# Patient Record
Sex: Male | Born: 1970 | Race: Asian | Hispanic: Yes | Marital: Married | Smoking: Never smoker
Health system: Southern US, Community
[De-identification: ages and names within clinical notes are randomized; demographics above are authoritative.]

## PROBLEM LIST (undated history)

## (undated) DIAGNOSIS — T7840XA Allergy, unspecified, initial encounter: Secondary | ICD-10-CM

## (undated) DIAGNOSIS — I00 Rheumatic fever without heart involvement: Secondary | ICD-10-CM

## (undated) DIAGNOSIS — Z8711 Personal history of peptic ulcer disease: Secondary | ICD-10-CM

## (undated) DIAGNOSIS — Z8719 Personal history of other diseases of the digestive system: Secondary | ICD-10-CM

## (undated) DIAGNOSIS — A159 Respiratory tuberculosis unspecified: Secondary | ICD-10-CM

## (undated) DIAGNOSIS — Z9189 Other specified personal risk factors, not elsewhere classified: Secondary | ICD-10-CM

## (undated) HISTORY — PX: NO PAST SURGERIES: SHX2092

## (undated) HISTORY — DX: Rheumatic fever without heart involvement: I00

## (undated) HISTORY — DX: Respiratory tuberculosis unspecified: A15.9

## (undated) HISTORY — DX: Personal history of other diseases of the digestive system: Z87.19

## (undated) HISTORY — DX: Allergy, unspecified, initial encounter: T78.40XA

## (undated) HISTORY — DX: Personal history of peptic ulcer disease: Z87.11

## (undated) HISTORY — DX: Other specified personal risk factors, not elsewhere classified: Z91.89

---

## 2005-08-07 ENCOUNTER — Encounter: Admission: RE | Admit: 2005-08-07 | Discharge: 2005-08-07 | Payer: Self-pay | Admitting: Family Medicine

## 2010-07-13 ENCOUNTER — Encounter: Payer: Self-pay | Admitting: Family Medicine

## 2010-07-13 ENCOUNTER — Ambulatory Visit (INDEPENDENT_AMBULATORY_CARE_PROVIDER_SITE_OTHER): Payer: BC Managed Care – PPO | Admitting: Family Medicine

## 2010-07-13 DIAGNOSIS — M25539 Pain in unspecified wrist: Secondary | ICD-10-CM

## 2010-07-13 DIAGNOSIS — M549 Dorsalgia, unspecified: Secondary | ICD-10-CM

## 2010-07-13 HISTORY — DX: Dorsalgia, unspecified: M54.9

## 2010-07-13 HISTORY — DX: Pain in unspecified wrist: M25.539

## 2010-07-18 NOTE — Assessment & Plan Note (Signed)
Summary: LOWER BACK AND WRIST PAIN/NP/LP # [phone]   Vital Signs:  Patient profile:   40 year old male Height:      67 inches (170.18 cm) Weight:      170.2 pounds (77.36 kg) BMI:     26.75 Temp:     98.0 degrees F (36.67 degrees C) oral Pulse rate:   60 / minute BP sitting:   121 / 70  (right arm)  Vitals Entered By: Baxter Hire) (July 13, 2010 2:51 PM) CC: lower back and wrist pain Pain Assessment Patient in pain? yes     Location: left wrist Intensity: 4 Nutritional Status BMI of 25 - 29 = overweight  Does patient need assistance? Functional Status Self care Ambulation Normal   CC:  lower back and wrist pain.  History of Present Illness: 40 yo M here with left wrist and low back pain  1. L wrist pain Patient denies known injury States over past 3 months has had pain more on dorsal aspect of left wrist Uses computer a lot at work, does a lot of typing. Pain now hurting up to elbow. Moved mouse to right side and noticed improvement over past week. No swelling or bruising. Worse with full extension at wrist joint. No numbness or tingling.  2. Back pain Also hurts for past 3 months Slowly worsening over that time Has episodes of flare ups. No known injury. Prolonged sitting or prolonged standing worsens, better with movement No FH back problems No bowel/bladder issues No numbness or tingling No radiation into buttocks or legs  Habits & Providers  Alcohol-Tobacco-Diet     Alcohol drinks/day: 1-every other day     Tobacco Status: never  Problems Prior to Update: None  Medications Prior to Update: 1)  None  Allergies (verified): No Known Drug Allergies  Family History: negative for HTN, DM, heart disease  Social History: no tobacco use occasional alcohol use works as Airline pilot for [employer] Status:  never  Physical Exam  General:  Well-developed,well-nourished,in no acute distress; alert,appropriate and cooperative  throughout examination Msk:  L wrist: No gross deformity, swelling, or bruising. TTP dorsal wrist at radiocarpal joint.  No other TTP about wrist, fingers.  No TTP at elbow over epicondyles. FROM wrist but pain with full extension at wrist.  No pain with finger extension.   Strength 5/5 with all wrist motions - no pain with resisted wrist extension. No pain with ulnar deviation.  No TTP at [facility]. 2+ radial pulses Negative tinels and phalens  R wrist: FROM without pain, swelling, weakness.  Back: No gross deformity, swelling, or bruising. Mild pain with full flexion FROM. No midline or bony TTP.  Minimal TTP bilateral paraspinal regions lumbar spine. Strength 5/5 BLEs MSRs 2+ and equal in bilateral patellar and achilles tendons Negative SLRs Negative Fabers and piriformis stretches.   Impression & Recommendations:  Problem # 1:  WRIST PAIN, LEFT (JYN-829.56) Assessment New  Wrist exam benign outside of pain on full extension and tenderness at radiocarpal joint.  2/2 overuse - more likely synovitis than tendinopathy given resisted motions of fingers and wrist do not reproduce his pain.  DJD is possible but less likely.  Relative rest, icing, nsaids, wrist brace - should continue to improve over the next 4 weeks then can discontinue brace and start strengthening exercises.  Orders: Brace Wrist (O1308)  Problem # 2:  BACK PAIN (ICD-724.5) Assessment: New No evidence of radiculopathy and no red flag symptoms or on  exam.  Start home exercises (wants to try these for 1-2 weeks first before trying PT.  Heat for muscle spasms, mobic, flexeril as needed.  Stated at end of OV that he tried chiropractic care without benefit.  Advised to stay active.  See instructions for further.  His updated medication list for this problem includes:    Meloxicam 15 Mg Tabs (Meloxicam) .Marland Kitchen... 1 tab by mouth daily with food x 7 days then as needed    Flexeril 5 Mg Tabs (Cyclobenzaprine hcl) .Marland Kitchen... 1 tab by  mouth three times a day as needed spasms  Complete Medication List: 1)  Meloxicam 15 Mg Tabs (Meloxicam) .Marland Kitchen.. 1 tab by mouth daily with food x 7 days then as needed 2)  Flexeril 5 Mg Tabs (Cyclobenzaprine hcl) .Marland Kitchen.. 1 tab by mouth three times a day as needed spasms  Patient Instructions: 1)  Your exam is consistent with musculoskeletal back pain 2)  Take meloxicam daily with food x 7 days then as needed for wrist and back. 3)  Try flexeril as muscle relaxant if needed for spasms - can make you sleepy so try at night first and don't drive if it makes you sleepy. 4)  Stay as active as possible 5)  Start physical therapy for your low back in 1-2 weeks if home exercises/stretches aren't working. 6)  Consider massage, chiropractor, physical therapy 7)  Knee to chest, opposite chest, pelvic tilt, cat/camel are usually the best stretches/exercises for this - hold 20-30 seconds and do 3 of each once a day. 8)  Start abdominal crunches, bicycles as well - work your way up to 3 sets of 30 of each once a day. 9)  For wrist, use medication noted above. 10)  Wrist brace will help prevent excessive wrist extension which increases tendinitis/inflammation. 11)  Follow up with me in 1 month for recheck on these issues. Prescriptions: FLEXERIL 5 MG TABS (CYCLOBENZAPRINE HCL) 1 tab by mouth three times a day as needed spasms  #60 x 0   Entered and Authorized by:   Norton Blizzard MD   Signed by:   Norton Blizzard MD on 07/13/2010   Method used:   Print then Give to Patient   RxID:   [id number] MELOXICAM 15 MG TABS (MELOXICAM) 1 tab by mouth daily with food x 7 days then as needed  #30 x 1   Entered and Authorized by:   Norton Blizzard MD   Signed by:   Norton Blizzard MD on 07/13/2010   Method used:   Print then Give to Patient   RxID:   [id number]    Orders Added: 1)  New Patient Level IV [99204] 2)  Brace Wrist [[id number]]

## 2010-08-02 ENCOUNTER — Encounter: Payer: Self-pay | Admitting: *Deleted

## 2010-10-23 ENCOUNTER — Ambulatory Visit: Payer: BC Managed Care – PPO | Attending: Family Medicine | Admitting: Physical Therapy

## 2010-10-23 DIAGNOSIS — M2560 Stiffness of unspecified joint, not elsewhere classified: Secondary | ICD-10-CM | POA: Insufficient documentation

## 2010-10-23 DIAGNOSIS — M545 Low back pain, unspecified: Secondary | ICD-10-CM | POA: Insufficient documentation

## 2010-10-23 DIAGNOSIS — IMO0001 Reserved for inherently not codable concepts without codable children: Secondary | ICD-10-CM | POA: Insufficient documentation

## 2010-10-25 ENCOUNTER — Ambulatory Visit: Payer: BC Managed Care – PPO | Admitting: Physical Therapy

## 2010-10-30 ENCOUNTER — Encounter: Payer: BC Managed Care – PPO | Admitting: Physical Therapy

## 2010-11-01 ENCOUNTER — Ambulatory Visit: Payer: BC Managed Care – PPO | Admitting: Rehabilitation

## 2011-11-28 ENCOUNTER — Encounter: Payer: Self-pay | Admitting: Family Medicine

## 2011-11-28 ENCOUNTER — Ambulatory Visit: Payer: BC Managed Care – PPO

## 2011-11-28 ENCOUNTER — Ambulatory Visit (INDEPENDENT_AMBULATORY_CARE_PROVIDER_SITE_OTHER): Payer: BC Managed Care – PPO | Admitting: Family Medicine

## 2011-11-28 VITALS — BP 112/64 | HR 66 | Temp 97.9°F | Resp 16 | Ht 68.5 in | Wt 161.0 lb

## 2011-11-28 DIAGNOSIS — M25569 Pain in unspecified knee: Secondary | ICD-10-CM

## 2011-11-28 DIAGNOSIS — M7650 Patellar tendinitis, unspecified knee: Secondary | ICD-10-CM

## 2011-11-28 NOTE — Progress Notes (Signed)
   Date:  11/28/2011   Name:  GALEN RUSSMAN   DOB:  Oct 03, 1970   MRN:  [id number]  PCP:  No primary provider on file.    Chief Complaint: Knee Pain   History of Present Illness:  Juan Collier is a 41 y.o. very pleasant male patient who presents with the following:  He is having trouble with his left leg mostly with running- he has pain in the proximal tibia.  He has noted pain off an on, especially when he has intensified his regimen for a year or so.  However, over the last 2 weeks it has been more persistent and he has not been able to run at all in a couple of weeks without pain.  He also has trouble getting up stairs.    He typically runs about 20 miles a week.   Right knee is ok.  He had rheumatic arthritis at age 40 and this leg to different development in his two knees.  The left knee is "smaller" than the right  Patient Active Problem List  Diagnosis  . WRIST PAIN, LEFT  . BACK PAIN   No past medical history on file. No past surgical history on file. History  Substance Use Topics  . Smoking status: Never Smoker   . Smokeless tobacco: Not on file  . Alcohol Use: Not on file   No family history on file. No Known Allergies  Medication list has been reviewed and updated.  Current Outpatient Prescriptions on File Prior to Visit  Medication Sig Dispense Refill  . cyclobenzaprine (FLEXERIL) 5 MG tablet Take 5 mg by mouth 3 (three) times daily as needed. spasms       . meloxicam (MOBIC) 15 MG tablet Take 15 mg by mouth daily. With food x 7 days then as needed         Review of Systems:  As per HPI- otherwise negative. Generally very healthy  Physical Examination: Filed Vitals:   11/28/11 1249  BP: 112/64  Pulse: 66  Temp: 97.9 F (36.6 C)  Resp: 16   Filed Vitals:   11/28/11 1249  Height: 5' 8.5" (1.74 m)  Weight: 161 lb (73.029 kg)   Body mass index is 24.12 kg/(m^2). Ideal Body Weight: Weight in (lb) to have BMI = 25: 166.5    GEN: WDWN, NAD,  Non-toxic, Alert & Oriented x 3 HEENT: Atraumatic, Normocephalic.  Ears and Nose: No external deformity. EXTR: No clubbing/cyanosis/edema NEURO: Normal gait.  PSYCH: Normally interactive. Conversant. Not depressed or anxious appearing.  Calm demeanor.  Left knee: tender at proximal tibia at insertion of patellar ligament.  Knee stable, no swelling, effusion, redness or heat.    [facility] reading (PRIMARY) by  Dr. Patsy Lager. Left knee 4V: negative Right knee 2V: negative  Assessment and Plan: 1. Knee pain  DG Knee Complete 4 Views Left, DG Knee 1-2 Views Right  2. Patellar tendonitis     Suspect Jumper's Knee.  Gave hand-out.  Eccentric leg exercises, NSAIDs, rest, ice, avoidance of painful activities.  If he would like to see PT please let me know.  He also has seen Dr. Herbert Moors in the past and could follow- up with him as well.  Patient (or parent if minor) instructed to return to clinic or call if not better in 10-14 day(s).   Abbe Amsterdam, MD

## 2012-07-12 ENCOUNTER — Ambulatory Visit (INDEPENDENT_AMBULATORY_CARE_PROVIDER_SITE_OTHER): Payer: BC Managed Care – PPO | Admitting: Internal Medicine

## 2012-07-12 VITALS — BP 122/81 | HR 75 | Temp 98.0°F | Resp 17 | Ht 69.0 in | Wt 161.0 lb

## 2012-07-12 DIAGNOSIS — K219 Gastro-esophageal reflux disease without esophagitis: Secondary | ICD-10-CM

## 2012-07-12 DIAGNOSIS — R5381 Other malaise: Secondary | ICD-10-CM

## 2012-07-12 DIAGNOSIS — B370 Candidal stomatitis: Secondary | ICD-10-CM

## 2012-07-12 DIAGNOSIS — Q383 Other congenital malformations of tongue: Secondary | ICD-10-CM

## 2012-07-12 LAB — COMPREHENSIVE METABOLIC PANEL
AST: 22 U/L (ref 0–37)
Alkaline Phosphatase: 55 U/L (ref 39–117)
CO2: 25 mEq/L (ref 19–32)
Calcium: 9.8 mg/dL (ref 8.4–10.5)
Chloride: 101 mEq/L (ref 96–112)
Potassium: 4.1 mEq/L (ref 3.5–5.3)
Total Bilirubin: 0.5 mg/dL (ref 0.3–1.2)
Total Protein: 8 g/dL (ref 6.0–8.3)

## 2012-07-12 LAB — POCT CBC
HCT, POC: 49.7 % (ref 43.5–53.7)
Hemoglobin: 16.5 g/dL (ref 14.1–18.1)
MCH, POC: 31 pg (ref 27–31.2)
MCHC: 33.2 g/dL (ref 31.8–35.4)
MID (cbc): 0.5 (ref 0–0.9)
POC MID %: 7.3 %M (ref 0–12)
RBC: 5.32 M/uL (ref 4.69–6.13)
RDW, POC: 13 %
WBC: 6.8 10*3/uL (ref 4.6–10.2)

## 2012-07-12 LAB — LIPID PANEL
Cholesterol: 190 mg/dL (ref 0–200)
HDL: 54 mg/dL (ref >39)
Triglycerides: 108 mg/dL (ref <150)

## 2012-07-12 LAB — TSH: TSH: 1.831 u[IU]/mL (ref 0.350–4.500)

## 2012-07-12 LAB — POCT SKIN KOH: Skin KOH, POC: POSITIVE

## 2012-07-12 MED ORDER — RANITIDINE HCL 150 MG PO CAPS: 150.0000 mg | ORAL_CAPSULE | Freq: Two times a day (BID) | ORAL | Status: DC

## 2012-07-12 MED ORDER — NYSTATIN 100000 UNIT/ML MT SUSP: 500000.0000 [IU] | Freq: Four times a day (QID) | OROMUCOSAL | Status: DC

## 2012-07-12 NOTE — Progress Notes (Signed)
  Subjective:    Patient ID: Juan Collier, male    DOB: [birth year], 42 y.o.   MRN: [id number]  HPI 2 mos burning tongue Feels like sandpaper Taste ok Worse after eating Burning in epigastr area as well Stopped coffee/added freq eating-sl better//gastritis in college  occas episodes of tunnel visions/while walking vision seems to black out/feels faint for 5-10 seconds but recovers Never has problems running/run 10 to 20 miles a week  Married/3 kids/2 jobs/sleeps 4 hours a night  Review of Systems 5 lbs loss recent on purpose /runner 20 miles a week No chest pain or palpitations No dyspnea on exertion    Objective:   Physical Exam Vital signs stable HEENT clear including tongue which is only slightly glossy No nodes or thyromegaly Chest clear Heart regular without murmur Abdomen soft with no organomegaly or masses       Results for orders placed in visit on 07/12/12  POCT CBC      Result Value Range   WBC 6.8  4.6 - 10.2 K/uL   Lymph, poc 2.0  0.6 - 3.4   POC LYMPH PERCENT 29.0  10 - 50 %L   MID (cbc) 0.5  0 - 0.9   POC MID % 7.3  0 - 12 %M   POC Granulocyte 4.3  2 - 6.9   Granulocyte percent 63.7  37 - 80 %G   RBC 5.32  4.69 - 6.13 M/uL   Hemoglobin 16.5  14.1 - 18.1 g/dL   HCT, POC 30.8  65.7 - 53.7 %   MCV 93.4  80 - 97 fL   MCH, POC 31.0  27 - 31.2 pg   MCHC 33.2  31.8 - 35.4 g/dL   RDW, POC 84.6     Platelet Count, POC 241  142 - 424 K/uL   MPV 8.8  0 - 99.8 fL  POCT SKIN KOH      Result Value Range   Skin KOH, POC Positive      Assessment & Plan:  Problem #1 gastritis versus reflux Probable lifestyle cause  Problem #2 thrush-uncertain underlying cause  Labs Mycostatin  Zantac 150 twice a day

## 2012-07-15 ENCOUNTER — Encounter: Payer: Self-pay | Admitting: Internal Medicine

## 2013-05-30 ENCOUNTER — Ambulatory Visit (INDEPENDENT_AMBULATORY_CARE_PROVIDER_SITE_OTHER): Payer: BC Managed Care – PPO

## 2013-05-30 VITALS — HR 71 | Temp 98.5°F | Resp 16 | Ht 68.0 in | Wt 160.0 lb

## 2013-05-30 DIAGNOSIS — Z23 Encounter for immunization: Secondary | ICD-10-CM

## 2013-05-31 ENCOUNTER — Telehealth: Payer: Self-pay

## 2013-05-31 MED ORDER — OSELTAMIVIR PHOSPHATE 75 MG PO CAPS: 75.0000 mg | ORAL_CAPSULE | Freq: Two times a day (BID) | ORAL | Status: DC

## 2013-05-31 NOTE — Telephone Encounter (Signed)
Called Juan Collier, [place] advised Rx sent to his pharmacy.

## 2013-05-31 NOTE — Telephone Encounter (Signed)
Heather please advise.

## 2013-05-31 NOTE — Telephone Encounter (Signed)
Juan Collier stated he may have the flu, chills, cough, and body aches. Stated son was seen by [facility] on 05/30/13,son is taking Tamiflu. Patient is requesting Tamiflu to be called in for him.

## 2013-05-31 NOTE — Telephone Encounter (Signed)
Sent to [facility] on [address]. Start immediately

## 2013-05-31 NOTE — Telephone Encounter (Signed)
Spoke with pt, I made sure he knew his medication was at the pharmacy. Pt understood.

## 2014-05-31 ENCOUNTER — Encounter: Payer: Self-pay | Admitting: Family Medicine

## 2014-05-31 ENCOUNTER — Ambulatory Visit (INDEPENDENT_AMBULATORY_CARE_PROVIDER_SITE_OTHER): Payer: BC Managed Care – PPO | Admitting: Family Medicine

## 2014-05-31 VITALS — BP 124/70 | HR 55 | Temp 98.6°F | Resp 16 | Ht 68.25 in | Wt 166.8 lb

## 2014-05-31 DIAGNOSIS — R079 Chest pain, unspecified: Secondary | ICD-10-CM

## 2014-05-31 DIAGNOSIS — R002 Palpitations: Secondary | ICD-10-CM

## 2014-05-31 LAB — COMPREHENSIVE METABOLIC PANEL
ALK PHOS: 50 U/L (ref 39–117)
ALT: 25 U/L (ref 0–53)
AST: 22 U/L (ref 0–37)
Albumin: 4.3 g/dL (ref 3.5–5.2)
BUN: 15 mg/dL (ref 6–23)
CHLORIDE: 100 meq/L (ref 96–112)
CO2: 28 mEq/L (ref 19–32)
CREATININE: 0.86 mg/dL (ref 0.50–1.35)
Calcium: 9.9 mg/dL (ref 8.4–10.5)
GLUCOSE: 117 mg/dL — AB (ref 70–99)
POTASSIUM: 3.8 meq/L (ref 3.5–5.3)
SODIUM: 136 meq/L (ref 135–145)
TOTAL PROTEIN: 7.4 g/dL (ref 6.0–8.3)
Total Bilirubin: 0.5 mg/dL (ref 0.2–1.2)

## 2014-05-31 LAB — CBC
HCT: 44 % (ref 39.0–52.0)
Hemoglobin: 15.3 g/dL (ref 13.0–17.0)
MCH: 31.3 pg (ref 26.0–34.0)
MCHC: 34.8 g/dL (ref 30.0–36.0)
MCV: 90 fL (ref 78.0–100.0)
MPV: 9.1 fL (ref 8.6–12.4)
PLATELETS: 273 10*3/uL (ref 150–400)
RBC: 4.89 MIL/uL (ref 4.22–5.81)
RDW: 13.8 % (ref 11.5–15.5)
WBC: 5.7 10*3/uL (ref 4.0–10.5)

## 2014-05-31 LAB — TSH: TSH: 1.285 u[IU]/mL (ref 0.350–4.500)

## 2014-05-31 LAB — LIPID PANEL
CHOLESTEROL: 206 mg/dL — AB (ref 0–200)
HDL: 54 mg/dL (ref >39)
LDL CALC: 130 mg/dL — AB (ref 0–99)
Total CHOL/HDL Ratio: 3.8 Ratio
Triglycerides: 109 mg/dL (ref <150)
VLDL: 22 mg/dL (ref 0–40)

## 2014-05-31 LAB — MAGNESIUM: Magnesium: 1.9 mg/dL (ref 1.5–2.5)

## 2014-05-31 NOTE — Progress Notes (Signed)
Subjective:    Patient ID: Juan Collier, male    DOB: 07-14-1970, 44 y.o.   MRN: [id number]  HPI This is a pleasant 44 yo male who presents today with 3 months of heart palpitations. He has had a sensation of heart in his throat, fast heart rate, dizziness. Passes in about 5 seconds with residual chest soreness. The other thing that he notices is that he has chest pressure/tightness. Also lasts 5-10 seconds. These episodes occur when he is sitting down. These episodes never happen when he runs- 12-18 miles a week. The pattern is very inconsistent. He can have 3 episodes in a week or go 1-2 weeks without episodes. He does not have diaphoresis, nausea, vomiting or radiation of pain.   He has had a history of "black outs"- has not had one for about 1 year. Usually when he is walking. Can have up to 6-7 in 2 weeks. Never happens with driving, running. Feels presyncopal and sees black. Is able to stop and steady himself and the episode passes in a few seconds.   Parents and siblings all alive/healthy. Patient had rhumatic fever as a child and has had an intermittent murmur. RF was in his left knee.   He reports a weight gain of 7 pounds over the last couple of months. He attributes this to decreased exercise during a busy work period and increased dietary intake over the holidays. He denies any increased stress.   Review of Systems  Constitutional: Negative for diaphoresis, fatigue and unexpected weight change.  Respiratory: Positive for chest tightness and shortness of breath. Negative for cough and wheezing.   Cardiovascular: Positive for chest pain and palpitations. Negative for leg swelling.  Gastrointestinal: Negative for abdominal pain, diarrhea and constipation.  Neurological: Positive for dizziness and headaches (rare). Negative for syncope and weakness.  Psychiatric/Behavioral: The patient is not nervous/anxious.       Objective:   Physical Exam  Constitutional: He is oriented to  person, place, and time. He appears well-developed and well-nourished. No distress.  HENT:  Head: Normocephalic and atraumatic.  Right Ear: Tympanic membrane, external ear and ear canal normal.  Left Ear: Tympanic membrane, external ear and ear canal normal.  Nose: Nose normal.  Mouth/Throat: Oropharynx is clear and moist. No oropharyngeal exudate.  Eyes: Conjunctivae are normal. Pupils are equal, round, and reactive to light. Right eye exhibits no discharge. Left eye exhibits no discharge. Right conjunctiva is not injected. Left conjunctiva is not injected.  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   Neck: Normal range of motion. Neck supple. No JVD present. No thyromegaly present.  Cardiovascular: Normal rate, regular rhythm, normal heart sounds and intact distal pulses.   Pulmonary/Chest: Effort normal and breath sounds normal. No respiratory distress. He has no wheezes. He has no rales. He exhibits no tenderness.  Musculoskeletal: Normal range of motion.  Lymphadenopathy:    He has no cervical adenopathy.  Neurological: He is alert and oriented to person, place, and time.  Skin: Skin is warm and dry. He is not diaphoretic.  Psychiatric: He has a normal mood and affect. His behavior is normal. Judgment and thought content normal.  Vitals reviewed. BP 124/70 mmHg  Pulse 55  Temp(Src) 98.6 F (37 C) (Oral)  Resp 16  Ht 5' 8.25" (1.734 m)  Wt 166 lb 12.8 oz (75.66 kg)  BMI 25.16  kg/m2  SpO2 100% EKG- reviewed with Dr. Carlota Raspberry- sinus bradycardia    Assessment & Plan:  1. Palpitations - CBC - Comprehensive metabolic panel - Lipid panel - TSH - Magnesium - EKG 12-Lead - Amb ref to cardiology - Instructed patient to avoid exercising alone -RTC if symptoms worsen or increase in frequency  2. Chest pain, unspecified chest pain type - CBC - Comprehensive  metabolic panel - Lipid panel - TSH - Magnesium - EKG 12-Lead   Elby Beck, FNP-BC  [facility]  05/31/2014 2:24 PM

## 2014-05-31 NOTE — Patient Instructions (Signed)

## 2014-06-01 ENCOUNTER — Encounter: Payer: Self-pay | Admitting: Family Medicine

## 2014-06-02 NOTE — Progress Notes (Signed)
EKG read and patient discussed with Ms. Gessner. Agree with assessment and plan of care per her note.   

## 2014-06-23 ENCOUNTER — Institutional Professional Consult (permissible substitution): Payer: Self-pay | Admitting: Cardiology

## 2014-08-18 ENCOUNTER — Ambulatory Visit (INDEPENDENT_AMBULATORY_CARE_PROVIDER_SITE_OTHER): Payer: BC Managed Care – PPO | Admitting: Urgent Care

## 2014-08-18 VITALS — BP 108/68 | HR 65 | Temp 97.6°F | Resp 17 | Ht 68.0 in | Wt 166.0 lb

## 2014-08-18 DIAGNOSIS — S39012A Strain of muscle, fascia and tendon of lower back, initial encounter: Secondary | ICD-10-CM

## 2014-08-18 DIAGNOSIS — M545 Low back pain: Secondary | ICD-10-CM

## 2014-08-18 MED ORDER — KETOROLAC TROMETHAMINE 30 MG/ML IJ SOLN
30.0000 mg | Freq: Once | INTRAMUSCULAR | Status: AC
  Administered 2014-08-18: 30 mg via INTRAMUSCULAR

## 2014-08-18 MED ORDER — CYCLOBENZAPRINE HCL 5 MG PO TABS: ORAL_TABLET | ORAL | Status: DC

## 2014-08-18 MED ORDER — MELOXICAM 7.5 MG PO TABS: 7.5000 mg | ORAL_TABLET | Freq: Every day | ORAL | Status: DC

## 2014-08-18 NOTE — Patient Instructions (Signed)
- You may take 1 tablet of meloxicam 6 hours from now. - Generally you can use up to 2 tablets per day to help with back pain. But if 1 tablet helps with your back pain, go with that.   Back Pain, Adult Low back pain is very common. About 1 in 5 people have back pain.The cause of low back pain is rarely dangerous. The pain often gets better over time.About half of people with a sudden onset of back pain feel better in just 2 weeks. About 8 in 10 people feel better by 6 weeks.  CAUSES Some common causes of back pain include:  Strain of the muscles or ligaments supporting the spine.  Wear and tear (degeneration) of the spinal discs.  Arthritis.  Direct injury to the back. DIAGNOSIS Most of the time, the direct cause of low back pain is not known.However, back pain can be treated effectively even when the exact cause of the pain is unknown.Answering your caregiver's questions about your overall health and symptoms is one of the most accurate ways to make sure the cause of your pain is not dangerous. If your caregiver needs more information, he or she may order lab work or imaging tests (X-rays or MRIs).However, even if imaging tests show changes in your back, this usually does not require surgery. HOME CARE INSTRUCTIONS For many people, back pain returns.Since low back pain is rarely dangerous, it is often a condition that people can learn to [facility] their own.   Remain active. It is stressful on the back to sit or stand in one place. Do not sit, drive, or stand in one place for more than 30 minutes at a time. Take short walks on level surfaces as soon as pain allows.Try to increase the length of time you walk each day.  Do not stay in bed.Resting more than 1 or 2 days can delay your recovery.  Do not avoid exercise or work.Your body is made to move.It is not dangerous to be active, even though your back may hurt.Your back will likely heal faster if you return to being active  before your pain is gone.  Pay attention to your body when you bend and lift. Many people have less discomfortwhen lifting if they bend their knees, keep the load close to their bodies,and avoid twisting. Often, the most comfortable positions are those that put less stress on your recovering back.  Find a comfortable position to sleep. Use a firm mattress and lie on your side with your knees slightly bent. If you lie on your back, put a pillow under your knees.  Only take over-the-counter or prescription medicines as directed by your caregiver. Over-the-counter medicines to reduce pain and inflammation are often the most helpful.Your caregiver may prescribe muscle relaxant drugs.These medicines help dull your pain so you can more quickly return to your normal activities and healthy exercise.  Put ice on the injured area.  Put ice in a plastic bag.  Place a towel between your skin and the bag.  Leave the ice on for 15-20 minutes, 03-04 times a day for the first 2 to 3 days. After that, ice and heat may be alternated to reduce pain and spasms.  Ask your caregiver about trying back exercises and gentle massage. This may be of some benefit.  Avoid feeling anxious or stressed.Stress increases muscle tension and can worsen back pain.It is important to recognize when you are anxious or stressed and learn ways to manage it.Exercise is a  great option. SEEK MEDICAL CARE IF:  You have pain that is not relieved with rest or medicine.  You have pain that does not improve in 1 week.  You have new symptoms.  You are generally not feeling well. SEEK IMMEDIATE MEDICAL CARE IF:   You have pain that radiates from your back into your legs.  You develop new bowel or bladder control problems.  You have unusual weakness or numbness in your arms or legs.  You develop nausea or vomiting.  You develop abdominal pain.  You feel faint. Document Released: 05/07/2005 Document Revised: 11/06/2011  Document Reviewed: 09/08/2013 [facility] Patient Information 2015 [place], [place]. This information is not intended to replace advice given to you by your health care provider. Make sure you discuss any questions you have with your health care provider.

## 2014-08-18 NOTE — Progress Notes (Signed)
    MRN: [id number] DOB: 11/29/1970  Subjective:   Juan RIBAUDO is a 44 y.o. male presenting for chief complaint of Back Pain  Reports 1 day history of low back pain without any aggravating factor. Pain has constant achy pain with intermittent sharp pains, non-radiating, worsened with prolonged sitting or sitting up straight. Patient had difficulty laying on his back last night, had trouble sleeping. Patient feels stiffness, tingling over his low back. 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 Of note, patient has had this pain before, "a long time ago", reports that it was a pinched nerve, resolved with "an injection". Has tried ibuprofen with minimal relief. Denies fevers, limb pain, decreased ROM, weakness, abdominal pain, incontinence, saddle paresthesia, back trauma, back surgeries. Denies smoking, social drinking on the weekends. Denies IV drug use. Denies any other aggravating or relieving factors, no other questions or concerns.  Brae has a current medication list which includes the following prescription(s): ibuprofen. He has No Known Allergies.  Tou  has a past medical history of Allergy and Rheumatic fever. Also  has no past surgical history on file.  ROS As in subjective.  Objective:   Vitals: BP 108/68 mmHg  Pulse 65  Temp(Src) 97.6 F (36.4 C) (Oral)  Resp 17  Ht 5\' 8"  (1.727 m)  Wt 166 lb (75.297 kg)  BMI 25.25 kg/m2  SpO2 100%  Physical Exam  Constitutional: He is oriented to person, place, and time and well-developed, well-nourished, and in no distress.  Cardiovascular: Normal rate.   Pulmonary/Chest: Effort normal.  Musculoskeletal:       Thoracic back: He exhibits spasm. He exhibits normal range of motion, no tenderness, no bony tenderness, no swelling, no edema, no deformity, no laceration and no pain.       Lumbar back: He exhibits normal range of motion, no tenderness, no bony  tenderness, no swelling, no edema, no deformity, no laceration, no pain and no spasm.  Negative SLR.  Neurological: He is alert and oriented to person, place, and time. He has normal reflexes.  Skin: Skin is warm and dry. No rash noted. No erythema. No pallor.   Assessment and Plan :   1. Low back pain without sciatica, unspecified back pain laterality 2. Lumbar strain, initial encounter - Likely has lumbar strain, advised NSAID with Flexeril at night, anticipatory guidance provided. - Return to clinic in 2 weeks if back pain does not resolve or sooner if symptoms worsen.  Jaynee Eagles, PA-C [facility] [phone] 08/18/2014 8:28 AM

## 2015-07-18 ENCOUNTER — Encounter: Payer: Self-pay | Admitting: Family Medicine

## 2015-07-18 ENCOUNTER — Ambulatory Visit (INDEPENDENT_AMBULATORY_CARE_PROVIDER_SITE_OTHER): Payer: BC Managed Care – PPO | Admitting: Family Medicine

## 2015-07-18 VITALS — BP 110/60 | HR 60 | Temp 97.7°F | Ht 68.0 in | Wt 167.8 lb

## 2015-07-18 DIAGNOSIS — Z131 Encounter for screening for diabetes mellitus: Secondary | ICD-10-CM

## 2015-07-18 DIAGNOSIS — Z13220 Encounter for screening for lipoid disorders: Secondary | ICD-10-CM | POA: Diagnosis not present

## 2015-07-18 DIAGNOSIS — K644 Residual hemorrhoidal skin tags: Secondary | ICD-10-CM

## 2015-07-18 DIAGNOSIS — R5383 Other fatigue: Secondary | ICD-10-CM

## 2015-07-18 DIAGNOSIS — Z7189 Other specified counseling: Secondary | ICD-10-CM

## 2015-07-18 DIAGNOSIS — R635 Abnormal weight gain: Secondary | ICD-10-CM

## 2015-07-18 DIAGNOSIS — Z7689 Persons encountering health services in other specified circumstances: Secondary | ICD-10-CM

## 2015-07-18 DIAGNOSIS — Z119 Encounter for screening for infectious and parasitic diseases, unspecified: Secondary | ICD-10-CM

## 2015-07-18 DIAGNOSIS — L299 Pruritus, unspecified: Secondary | ICD-10-CM

## 2015-07-18 DIAGNOSIS — K648 Other hemorrhoids: Secondary | ICD-10-CM

## 2015-07-18 LAB — LIPID PANEL
CHOL/HDL RATIO: 4
Cholesterol: 174 mg/dL (ref 0–200)
HDL: 46.3 mg/dL (ref >39.00)
LDL CALC: 108 mg/dL — AB (ref 0–99)
NonHDL: 128.09
TRIGLYCERIDES: 102 mg/dL (ref 0.0–149.0)
VLDL: 20.4 mg/dL (ref 0.0–40.0)

## 2015-07-18 LAB — COMPREHENSIVE METABOLIC PANEL
ALT: 18 U/L (ref 0–53)
AST: 19 U/L (ref 0–37)
Albumin: 4.3 g/dL (ref 3.5–5.2)
Alkaline Phosphatase: 44 U/L (ref 39–117)
BUN: 15 mg/dL (ref 6–23)
CALCIUM: 9.4 mg/dL (ref 8.4–10.5)
CHLORIDE: 105 meq/L (ref 96–112)
CO2: 26 meq/L (ref 19–32)
CREATININE: 0.78 mg/dL (ref 0.40–1.50)
GFR: 113.63 mL/min (ref >60.00)
Glucose, Bld: 100 mg/dL — ABNORMAL HIGH (ref 70–99)
POTASSIUM: 3.7 meq/L (ref 3.5–5.1)
SODIUM: 138 meq/L (ref 135–145)
Total Bilirubin: 0.4 mg/dL (ref 0.2–1.2)
Total Protein: 7.3 g/dL (ref 6.0–8.3)

## 2015-07-18 LAB — TSH: TSH: 1.3 u[IU]/mL (ref 0.35–4.50)

## 2015-07-18 LAB — CBC
HEMATOCRIT: 41.5 % (ref 39.0–52.0)
Hemoglobin: 14.1 g/dL (ref 13.0–17.0)
MCHC: 33.9 g/dL (ref 30.0–36.0)
MCV: 90.9 fl (ref 78.0–100.0)
Platelets: 252 10*3/uL (ref 150.0–400.0)
RBC: 4.57 Mil/uL (ref 4.22–5.81)
RDW: 13.3 % (ref 11.5–15.5)
WBC: 5.8 10*3/uL (ref 4.0–10.5)

## 2015-07-18 LAB — HEMOGLOBIN A1C: HEMOGLOBIN A1C: 5.4 % (ref 4.6–6.5)

## 2015-07-18 LAB — HIV ANTIBODY (ROUTINE TESTING W REFLEX): HIV 1&2 Ab, 4th Generation: NONREACTIVE

## 2015-07-18 MED ORDER — HYDROCORTISONE ACETATE 25 MG RE SUPP: 25.0000 mg | Freq: Two times a day (BID) | RECTAL | Status: DC | PRN

## 2015-07-18 MED ORDER — TRIAMCINOLONE ACETONIDE 0.1 % EX CREA: 1.0000 "application " | TOPICAL_CREAM | Freq: Two times a day (BID) | CUTANEOUS | Status: DC

## 2015-07-18 NOTE — Patient Instructions (Addendum)
We will check labs for you today and I will be in touch asap Try the stronger triamcinolone cream on the itchy spots You might also take a benadryl at night and a claritin/ zyrtec during the day. This should also help with your itching We will make sure that your thyroid is ok!   Try the suppositories for your hemorrhoid- you can also try some tuck's pads as needed Let me know if this does not go away  Wear the wrist splint at night for 2 weeks- if this does not go away let me know!

## 2015-07-18 NOTE — Progress Notes (Signed)
Pre visit review using our clinic review tool, if applicable. No additional management support is needed unless otherwise documented below in the visit note. 

## 2015-07-18 NOTE — Progress Notes (Signed)
[facility] [address], [place], [place] [address] [phone]  Date:  07/18/2015   Name:  Juan Collier   DOB:  1971-02-20   MRN:  [id number]  PCP:  Lamar Blinks, MD    Chief Complaint: New Patient (Initial Visit)   History of Present Illness:  [name] is a 45 y.o. very pleasant male patient who presents with the following:  Generally heathy person here today to establish care and to discuss several other issues-  I have seen this pt but it has been several years.    He is not fasting today- he did eat some rice, beans and an egg this am.  He has a history of TB exposure but did not have active TB He did have rheumatic fever as a teen which damaged his left knee but not his heart.   He does exercise- he runs some but notes that as of last fall he kind of lost his motivation and did gain some weight, about 10 lbs.  He is sleeping more than he has in the past also.  His daughter has a thyroid disorder and he would like to check on this today.   He does receive allergy shots once a week. About 10 days ago he got his usual shot and then 3 das later noted an outbreak on his skin which he thought must be due to poison ivy.  However the lesions are still there.  He is using a 0.025% triamcinolone cream, nothing else.  No SOB, no lip or tongue swelling  Wt Readings from Last 3 Encounters:  07/18/15 167 lb 12.8 oz (76.114 kg)  08/18/14 166 lb (75.297 kg)  05/31/14 166 lb 12.8 oz (75.66 kg)   He sees Dr. Katy Fitch for his eye care.    He has noted some intermittent numbness in his left hand for about 2 months now.  He has had this in the past but it went away. It does not seem worse during the morning, does not follow any other pattern that he has noticed.   It is only the left hand- he is right handed.  No other numbness or weakness anywhere else in his body Does not seem to effect any particular fingers- he feels like  the whole hand is numb.  It does not occur every day- can last a whole day and then be gone for several days  He also has noted a hemorrhoid over the last few weeks- it does not bleed but feels "raw" especially when he sits for a long period of time   Patient Active Problem List   Diagnosis Date Noted  . WRIST PAIN, LEFT 07/13/2010  . BACK PAIN 07/13/2010    Past Medical History  Diagnosis Date  . Allergy   . History of fainting spells of unknown cause   . History of stomach ulcers   . Tuberculosis positve   . Rheumatic fever     No past surgical history on file.  Social History  Substance Use Topics  . Smoking status: Never Smoker   . Smokeless tobacco: None  . Alcohol Use: 1.2 oz/week    2 Cans of beer per week    Family History  Problem Relation Age of Onset  . Thyroid disease Daughter   . ADD / ADHD Son   . Bipolar disorder Maternal Grandmother     Allergies  Allergen Reactions  . Mold Extract [  Trichophyton]   . [facility] [Quercus Robur]   . Pollen Extract     Medication list has been reviewed and updated.  Current Outpatient Prescriptions on File Prior to Visit  Medication Sig Dispense Refill  . azelastine (OPTIVAR) 0.05 % ophthalmic solution   0  . Azelastine HCl 0.15 % SOLN   0  . EPIPEN 2-PAK 0.3 MG/0.3ML SOAJ injection See admin instructions.  1  . ibuprofen (ADVIL,MOTRIN) 200 MG tablet Take 200 mg by mouth every 6 (six) hours as needed.    . meloxicam (MOBIC) 7.5 MG tablet Take 1 tablet (7.5 mg total) by mouth daily. 30 tablet 1  . cyclobenzaprine (FLEXERIL) 5 MG tablet Take 1-2 tablets po qhs (Patient not taking: Reported on 07/18/2015) 60 tablet 0   No current facility-administered medications on file prior to visit.    Review of Systems:  As per HPI- otherwise negative.   Physical Examination: Filed Vitals:   07/18/15 1040  BP: 110/60  Pulse: 60  Temp: 97.7 F (36.5 C)   Filed Vitals:   07/18/15 1040  Height: 5\' 8"  (1.727 m)   Weight: 167 lb 12.8 oz (76.114 kg)   Body mass index is 25.52 kg/(m^2). Ideal Body Weight: Weight in (lb) to have BMI = 25: 164.1  GEN: WDWN, NAD, Non-toxic, A & O x 3, looks well HEENT: Atraumatic, Normocephalic. Neck supple. No masses, No LAD. Ears and Nose: No external deformity. CV: RRR, No M/G/R. No JVD. No thrill. No extra heart sounds. PULM: CTA B, no wheezes, crackles, rhonchi. No retractions. No resp. distress. No accessory muscle use. ABD: S, NT, ND, +BS. No rebound. No HSM. EXTR: No c/c/e NEURO Normal gait.  PSYCH: Normally interactive. Conversant. Not depressed or anxious appearing.  Calm demeanor.  Normal strength of both hands, no current numbness or weakness.  No muscular atrophy He does have several scattered discrete ?bites over his chest, left shoulder and right knee.  They blanche with pressure No angioedema  Assessment and Plan: Establishing care with new doctor, encounter for  Screening for hyperlipidemia - Plan: Lipid panel  Screening for diabetes mellitus - Plan: Comprehensive metabolic panel, Hemoglobin A1c  Other fatigue - Plan: CBC, TSH  Screening examination for infectious disease - Plan: HIV antibody  Weight gain - Plan: TSH  Itchy skin - Plan: triamcinolone cream (KENALOG) 0.1 %  External hemorrhoid - Plan: hydrocortisone (ANUSOL-HC) 25 MG suppository  Await labs for him today Triamcinolone cream for his itchy areas- asked him to discuss with his allergist prior to any further immunotherapy shots.   See patient instructions for more details.      Signed Lamar Blinks, MD

## 2016-07-02 DIAGNOSIS — M25512 Pain in left shoulder: Secondary | ICD-10-CM

## 2016-07-02 HISTORY — DX: Pain in left shoulder: M25.512

## 2016-12-10 ENCOUNTER — Encounter: Payer: Self-pay | Admitting: Medical

## 2016-12-10 ENCOUNTER — Ambulatory Visit (INDEPENDENT_AMBULATORY_CARE_PROVIDER_SITE_OTHER): Payer: BC Managed Care – PPO | Admitting: Medical

## 2016-12-10 VITALS — BP 112/69 | HR 55 | Temp 98.0°F | Resp 16 | Ht 68.0 in | Wt 162.0 lb

## 2016-12-10 DIAGNOSIS — T7840XA Allergy, unspecified, initial encounter: Secondary | ICD-10-CM

## 2016-12-10 MED ORDER — PREDNISONE 10 MG PO TABS: ORAL_TABLET | ORAL | 0 refills | Status: DC

## 2016-12-10 MED ORDER — METHYLPREDNISOLONE ACETATE 40 MG/ML IJ SUSP
40.0000 mg | Freq: Once | INTRAMUSCULAR | Status: AC
  Administered 2016-12-10: 40 mg via INTRAMUSCULAR

## 2016-12-10 MED ORDER — HYDROXYZINE HCL 25 MG PO TABS: 25.0000 mg | ORAL_TABLET | Freq: Three times a day (TID) | ORAL | 0 refills | Status: DC | PRN

## 2016-12-10 NOTE — Progress Notes (Signed)
Subjective:    Patient ID: Juan Collier, male    DOB: August 15, 1970, 46 y.o.   MRN: [id number]  HPI  Pt in for some poison ivy. He was clearing some brush around a pond. He thinks some poison ivy. He is cleaning the area every week. The rash is not improving. Present for 2 weeks. Not improving.    He describes repeat exposure to brush but then repeat exposures.  Itching is keeping him up at night.No sob or wheezing.        Review of Systems  Constitutional: Negative for chills and fatigue.  Respiratory: Negative for cough, chest tightness, shortness of breath and wheezing.   Cardiovascular: Negative for chest pain and palpitations.  Gastrointestinal: Negative for abdominal pain.  Skin: Positive for rash.       See hpi and physical. Itching.  Hematological: Negative for adenopathy. Does not bruise/bleed easily.  Psychiatric/Behavioral: Negative for behavioral problems.    Past Medical History:  Diagnosis Date  . Allergy   . History of fainting spells of unknown cause   . History of stomach ulcers   . Rheumatic fever   . Tuberculosis positve      Social History   Social History  . Marital status: Married    Spouse name: N/A  . Number of children: N/A  . Years of education: N/A   Occupational History  . Not on file.   Social History Main Topics  . Smoking status: Never Smoker  . Smokeless tobacco: Never Used  . Alcohol use 1.2 oz/week    2 Cans of beer per week  . Drug use: No  . Sexual activity: Yes    Birth control/ protection: None   Other Topics Concern  . Not on file   Social History Narrative  . No narrative on file    No past surgical history on file.  Family History  Problem Relation Age of Onset  . Thyroid disease Daughter   . ADD / ADHD Son   . Bipolar disorder Maternal Grandmother     Allergies  Allergen Reactions  . Mold Extract [Trichophyton]   . [facility] [Quercus Robur]   . Pollen Extract     Current Outpatient Prescriptions  on File Prior to Visit  Medication Sig Dispense Refill  . EPIPEN 2-PAK 0.3 MG/0.3ML SOAJ injection See admin instructions.  1  . azelastine (OPTIVAR) 0.05 % ophthalmic solution   0  . Azelastine HCl 0.15 % SOLN   0  . hydrocortisone (ANUSOL-HC) 25 MG suppository Place 1 suppository (25 mg total) rectally 2 (two) times daily as needed for hemorrhoids or itching. (Patient not taking: Reported on 12/10/2016) 12 suppository 0  . ibuprofen (ADVIL,MOTRIN) 200 MG tablet Take 200 mg by mouth every 6 (six) hours as needed.    . meloxicam (MOBIC) 7.5 MG tablet Take 1 tablet (7.5 mg total) by mouth daily. (Patient not taking: Reported on 12/10/2016) 30 tablet 1  . triamcinolone cream (KENALOG) 0.1 % Apply 1 application topically 2 (two) times daily. (Patient not taking: Reported on 12/10/2016) 45 g 3   No current facility-administered medications on file prior to visit.     BP 112/69   Pulse (!) 55   Temp 98 F (36.7 C) (Oral)   Resp 16   Ht 5\' 8"  (1.727 m)   Wt 162 lb (73.5 kg)   SpO2 100%   BMI 24.63 kg/m       Objective:   Physical Exam  General- No acute distress. Pleasant patient. Neck- Full range of motion, no jvd Lungs- Clear, even and unlabored. Heart- regular rate and rhythm. Neurologic- CNII- XII grossly intact.  Rash- raised rash  in a linear pattern chin to upper chest. Some linear rash abdomen about 6 inches in length vertical orientation. Also mild rash mid lower back.      Assessment & Plan:  For allergic reaction we gave depomedrol 40 mg im. Also taper dose prednisone.   For itching hydroxyzine tablet.  Avoid re-exposure to potential poison ivy/brush.  Follow up as needed. I expect progressvie improvement over next week.

## 2016-12-10 NOTE — Patient Instructions (Addendum)
For allergic reaction we gave depomedrol 40 mg im. Also taper dose prednisone. For itching hydroxyzine tablet.  Avoid re-exposure to potential poison ivy/brush.  Follow up as needed. I expect progressvie improvement over next week.

## 2017-01-10 ENCOUNTER — Telehealth: Payer: BC Managed Care – PPO | Admitting: Physician Assistant

## 2017-01-10 DIAGNOSIS — R21 Rash and other nonspecific skin eruption: Secondary | ICD-10-CM

## 2017-01-10 NOTE — Progress Notes (Signed)
Based on what you shared with me it looks like you have a  condition that should be evaluated in a face to face office visit. This area could be bacterial (folliculitis), viral (herpes) or fungal (jock itch). It is hard to say without assessing. Please schedule an appointment with your primary care provider or seek a provider at an Urgent care for examination so the correct treatment can be given.  NOTE: Even if you have entered your credit card information for this eVisit, you will not be charged.   If you are having a true medical emergency please call 911.  If you need an urgent face to face visit, [facility] has four urgent care centers for your convenience.  If you need care fast and have a high deductible or no insurance consider:   [email or web address]  [phone]  [address] [place], [place] [address] 8 am to 8 pm Monday-Friday 10 am to 4 pm Saturday-Sunday   The following sites will take your  insurance:    . [facility] [place] a Provider at this Location  [address] [place], [place] [address] . 10 am to 8 pm Monday-Friday . 12 pm to 8 pm Saturday-Sunday   . [facility] at [place] a Provider at this Location  [address], [place], [place] [address] . 8 am to 8 pm Monday-Friday . 9 am to 6 pm Saturday . 11 am to 6 pm Sunday   . [facility] at [place] Get Driving Directions  [address] [place], [place] [address] . 8 am to 8 pm Monday-Friday . 8 am to 4 pm Saturday-Sunday   Your e-visit answers were reviewed by a board certified advanced clinical practitioner to complete your personal care plan.  Thank you for using e-Visits.

## 2017-01-23 ENCOUNTER — Ambulatory Visit (INDEPENDENT_AMBULATORY_CARE_PROVIDER_SITE_OTHER): Payer: BC Managed Care – PPO | Admitting: Medical

## 2017-01-23 ENCOUNTER — Ambulatory Visit: Payer: BC Managed Care – PPO | Admitting: Family Medicine

## 2017-01-23 ENCOUNTER — Encounter: Payer: Self-pay | Admitting: Medical

## 2017-01-23 VITALS — BP 112/66 | HR 55 | Temp 97.7°F | Resp 16 | Ht 68.0 in | Wt 160.2 lb

## 2017-01-23 DIAGNOSIS — N3943 Post-void dribbling: Secondary | ICD-10-CM

## 2017-01-23 DIAGNOSIS — L299 Pruritus, unspecified: Secondary | ICD-10-CM

## 2017-01-23 DIAGNOSIS — Z113 Encounter for screening for infections with a predominantly sexual mode of transmission: Secondary | ICD-10-CM | POA: Diagnosis not present

## 2017-01-23 DIAGNOSIS — R21 Rash and other nonspecific skin eruption: Secondary | ICD-10-CM

## 2017-01-23 MED ORDER — NYSTATIN 100000 UNIT/GM EX CREA: 1.0000 "application " | TOPICAL_CREAM | Freq: Two times a day (BID) | CUTANEOUS | 1 refills | Status: DC

## 2017-01-23 NOTE — Progress Notes (Signed)
Subjective:    Patient ID: Juan Collier, male    DOB: 06/15/1970, 46 y.o.   MRN: [id number]  HPI   Pt in states his irritation around base of penis for 2 months. Rash is red and itches.  No blister outbreak. No glands irritation. No pain at tip of penis. No dc from meatus.  At time rash painful when he sweats.  Rash has been coming and going. Pt states rare but rash at base of penis will weep. Pt states he notes that often if he applies showeres early am and pm then rash will resolve.   He jogs 6-10 miles a day. About 4-5 times a week.  He noticed day  2 months ago when rash started he ran and sat in wet shorts after jog all day.  Pt denies any blister out breaks.   Pt has been trying lotrimin and tolfinate. Neither helped.      Review of Systems  Constitutional: Negative for fatigue and fever.  Respiratory: Negative for choking, chest tightness, shortness of breath and wheezing.   Cardiovascular: Negative for chest pain and palpitations.  Gastrointestinal: Negative for abdominal pain, blood in stool, diarrhea, nausea and vomiting.  Genitourinary: Negative for decreased urine volume, dysuria, flank pain, frequency, hematuria, penile pain, scrotal swelling, testicular pain and urgency.       Post void dribble.  Musculoskeletal: Negative for back pain.  Skin:       See hpi.  Hematological: Negative for adenopathy. Does not bruise/bleed easily.    Past Medical History:  Diagnosis Date  . Allergy   . History of fainting spells of unknown cause   . History of stomach ulcers   . Rheumatic fever   . Tuberculosis positve      Social History   Social History  . Marital status: Married    Spouse name: N/A  . Number of children: N/A  . Years of education: N/A   Occupational History  . Not on file.   Social History Main Topics  . Smoking status: Never Smoker  . Smokeless tobacco: Never Used  . Alcohol use 1.2 oz/week    2 Cans of beer per week  . Drug use: No  .  Sexual activity: Yes    Birth control/ protection: None   Other Topics Concern  . Not on file   Social History Narrative  . No narrative on file    No past surgical history on file.  Family History  Problem Relation Age of Onset  . Thyroid disease Daughter   . ADD / ADHD Son   . Bipolar disorder Maternal Grandmother     Allergies  Allergen Reactions  . Mold Extract [Trichophyton]   . [facility] [Quercus Robur]   . Pollen Extract     Current Outpatient Prescriptions on File Prior to Visit  Medication Sig Dispense Refill  . azelastine (OPTIVAR) 0.05 % ophthalmic solution   0  . Azelastine HCl 0.15 % SOLN   0  . EPIPEN 2-PAK 0.3 MG/0.3ML SOAJ injection See admin instructions.  1  . hydrocortisone (ANUSOL-HC) 25 MG suppository Place 1 suppository (25 mg total) rectally 2 (two) times daily as needed for hemorrhoids or itching. 12 suppository 0  . hydrOXYzine (ATARAX/VISTARIL) 25 MG tablet Take 1 tablet (25 mg total) by mouth every 8 (eight) hours as needed for itching. 21 tablet 0  . ibuprofen (ADVIL,MOTRIN) 200 MG tablet Take 200 mg by mouth every 6 (six) hours as needed.    Marland Kitchen  meloxicam (MOBIC) 7.5 MG tablet Take 1 tablet (7.5 mg total) by mouth daily. 30 tablet 1  . triamcinolone cream (KENALOG) 0.1 % Apply 1 application topically 2 (two) times daily. 45 g 3   No current facility-administered medications on file prior to visit.     BP 112/66   Pulse (!) 55   Temp 97.7 F (36.5 C) (Oral)   Resp 16   Ht 5\' 8"  (1.727 m)   Wt 160 lb 3.2 oz (72.7 kg)   SpO2 100%   BMI 24.36 kg/m       Objective:   Physical Exam  General- No acute distress. Pleasant patient. Neck- Full range of motion, no jvd Lungs- Clear, even and unlabored. Heart- regular rate and rhythm. Neurologic- CNII- XII grossly intact. Genital exam- normal glands and normal penis. At base of penis inside pubic hair region faint mild diffuse pink rash. No vesicles seen.      Assessment & Plan:  For your  rash in your groin area recommend you use mycostatin cream daily. Keep your groin/gential area dry and take at least 5 day break from jogging.   Will rule out hsv type antibody studies. Will draw blood today and try to find ordering code.  For post void mild dribbling will get psa today. Follow this result. If your symptoms worsen let us know.  Follow up in 7- 10days or as needed

## 2017-01-23 NOTE — Patient Instructions (Addendum)
For your rash in your groin area recommend you use mycostatin cream daily. Keep your groin/genital area dry and take at least 5 day break from jogging.   Will rule out hsv type antibody studies. Will draw blood today and try to find ordering code.  For post void mild dribbling will get psa today. Follow this result. If your symptoms worsen let us know.  Follow up in 7- 10days or as needed

## 2017-01-24 LAB — PSA: PSA: 1.18 ng/mL (ref 0.10–4.00)

## 2017-01-30 LAB — HSV(HERPES SIMPLEX VRS) I + II AB-IGG
HAV 1 IGG,TYPE SPECIFIC AB: <0.9 index
HSV 2 IGG,TYPE SPECIFIC AB: <0.9 index

## 2017-01-30 LAB — HSV 1/2 AB (IGM), IFA W/RFLX TITER
HSV 1 IgM Screen: NEGATIVE
HSV 2 IGM SCREEN: NEGATIVE

## 2017-02-04 ENCOUNTER — Telehealth: Payer: Self-pay | Admitting: Medical

## 2017-02-04 DIAGNOSIS — R21 Rash and other nonspecific skin eruption: Secondary | ICD-10-CM

## 2017-02-04 NOTE — Telephone Encounter (Signed)
Pt returning call about results (see note on 01-23-2017 from lab results), please call  pt at [facility] [phone].

## 2017-02-04 NOTE — Telephone Encounter (Signed)
Please notify patient that I went ahead and put in a dermatology referral for him. Sorry to hear that his rash is persisting. If no call regarding referral date by Thursday of this week have him call/ask to speak to [facility] for update on referral date.

## 2017-02-04 NOTE — Telephone Encounter (Signed)
Referral to dermatologist placed. 

## 2017-02-04 NOTE — Telephone Encounter (Signed)
Pt states that rash is still the same.

## 2017-02-07 MED ORDER — DOXYCYCLINE HYCLATE 100 MG PO TABS: 100.0000 mg | ORAL_TABLET | Freq: Two times a day (BID) | ORAL | 0 refills | Status: DC

## 2017-02-07 NOTE — Telephone Encounter (Signed)
I did not think that the area appeared like infection. But if he declines dermatology referral presently and he wants to try antibiotic we could go that route. I will send in doxycycline. If you would advise him about making sure to have food in his stomach before he takes a tablet. And also avoid prolonged sun exposure. If rash still persists in 7 days then definitely recommend dermatologist.

## 2017-02-07 NOTE — Telephone Encounter (Signed)
Left pt a detailed message of  Below message.

## 2017-02-07 NOTE — Telephone Encounter (Signed)
Spoke with pt. Pt does not think he needs dermatology. Pt states last time he had the same rash he took antibiotic for it and it went away.

## 2017-10-01 ENCOUNTER — Encounter: Payer: Self-pay | Admitting: Internal Medicine

## 2017-10-01 ENCOUNTER — Ambulatory Visit: Payer: BC Managed Care – PPO | Admitting: Internal Medicine

## 2017-10-01 VITALS — BP 108/66 | HR 70 | Temp 97.9°F | Resp 14 | Ht 68.0 in | Wt 167.5 lb

## 2017-10-01 DIAGNOSIS — M545 Low back pain, unspecified: Secondary | ICD-10-CM

## 2017-10-01 MED ORDER — PREDNISONE 10 MG PO TABS: ORAL_TABLET | ORAL | 0 refills | Status: DC

## 2017-10-01 MED ORDER — CYCLOBENZAPRINE HCL 10 MG PO TABS: 10.0000 mg | ORAL_TABLET | Freq: Every evening | ORAL | 0 refills | Status: DC | PRN

## 2017-10-01 NOTE — Patient Instructions (Signed)
Take prednisone as prescribed  Tylenol  500 mg OTC 2 tabs a day every 8 hours as needed for pain  Flexeril a muscle relaxant, at night.  Will cause some drowsiness  Heating pad 1 or 2 hours twice a day  Call if not gradually better  Call anytime if severe symptoms

## 2017-10-01 NOTE — Progress Notes (Signed)
Subjective:    Patient ID: Juan Collier, male    DOB: [birth year], 47 y.o.   MRN: [id number]  DOS:  10/01/2017 Type of visit - description : Acute visit Interval history: Patient was picking up 2 gallons of paint yesterday during the process developed acute back pain located at the low right side, some radiation to the R  buttock and even to the proximal R posterior thigh. The pain is better depending on certain positions and worse when he sits down, tries to get up or walk. Has been taking ibuprofen with some relief.   Review of Systems No fever chills No rash No actual injury No urinary symptoms  Past Medical History:  Diagnosis Date  . Allergy   . History of fainting spells of unknown cause   . History of stomach ulcers   . Rheumatic fever   . Tuberculosis positve     No past surgical history on file.  Social History   Socioeconomic History  . Marital status: Married    Spouse name: Not on file  . Number of children: Not on file  . Years of education: Not on file  . Highest education level: Not on file  Occupational History  . Not on file  Social Needs  . Financial resource strain: Not on file  . Food insecurity:    Worry: Not on file    Inability: Not on file  . Transportation needs:    Medical: Not on file    Non-medical: Not on file  Tobacco Use  . Smoking status: Never Smoker  . Smokeless tobacco: Never Used  Substance and Sexual Activity  . Alcohol use: Yes    Alcohol/week: 1.2 oz    Types: 2 Cans of beer per week  . Drug use: No  . Sexual activity: Yes    Birth control/protection: None  Lifestyle  . Physical activity:    Days per week: Not on file    Minutes per session: Not on file  . Stress: Not on file  Relationships  . Social connections:    Talks on phone: Not on file    Gets together: Not on file    Attends religious service: Not on file    Active member of club or organization: Not on file    Attends meetings of clubs or  organizations: Not on file    Relationship status: Not on file  . Intimate partner violence:    Fear of current or ex partner: Not on file    Emotionally abused: Not on file    Physically abused: Not on file    Forced sexual activity: Not on file  Other Topics Concern  . Not on file  Social History Narrative  . Not on file      Allergies as of 10/01/2017      Reactions   Mold Extract [trichophyton]    Oak Bark [quercus Robur]    Pollen Extract       Medication List        Accurate as of 10/01/17 11:59 PM. Always use your most recent med list.          cyclobenzaprine 10 MG tablet Commonly known as:  FLEXERIL Take 1 tablet (10 mg total) by mouth at bedtime as needed for muscle spasms.   EPIPEN 2-PAK 0.3 mg/0.3 mL Soaj injection Generic drug:  EPINEPHrine See admin instructions.   ibuprofen 200 MG tablet Commonly known as:  ADVIL,MOTRIN Take 200 mg by mouth every  6 (six) hours as needed.   predniSONE 10 MG tablet Commonly known as:  DELTASONE 4 tablets x 2 days, 3 tabs x 2 days, 2 tabs x 2 days, 1 tab x 2 days          Objective:   Physical Exam BP 108/66 (BP Location: Left Arm, Patient Position: Standing, Cuff Size: Small)   Pulse 70   Temp 97.9 F (36.6 C) (Oral)   Resp 14   Ht 5\' 8"  (1.727 m)   Wt 167 lb 8 oz (76 kg)   SpO2 98%   BMI 25.47 kg/m  General:   Well developed, well nourished . NAD.  HEENT:  Normocephalic . Face symmetric, atraumatic   Abdomen:  Not distended, soft, non-tender. No rebound or rigidity. MSK: No TTP at the lower back. Skin: Not pale. Not jaundice Neurologic:  alert & oriented X3.  Speech normal, gait and posture mildly to moderately antalgic. Motor symmetric, DTR symmetric.  Straight leg test negative although it causes discomfort on the back Psych--  Cognition and judgment appear intact.  Cooperative with normal attention span and concentration.  Behavior appropriate. No anxious or depressed appearing.       Assessment & Plan:   47 year old gentleman in good health, h/o PUD years, + PPD (treated remotely) presents w/ : Back sprain: No red flag symptoms, history of PUD, recommend conservative treatment with prednisone, Tylenol and a muscle relaxant.  Also a heating pad. If not better needs to call and see PCP.

## 2017-10-01 NOTE — Progress Notes (Signed)
Pre visit review using our clinic review tool, if applicable. No additional management support is needed unless otherwise documented below in the visit note. 

## 2017-10-10 ENCOUNTER — Encounter: Payer: Self-pay | Admitting: Medical

## 2017-10-10 ENCOUNTER — Ambulatory Visit: Payer: BC Managed Care – PPO | Admitting: Medical

## 2017-10-10 VITALS — BP 111/71 | HR 58 | Temp 97.7°F | Resp 16 | Ht 68.0 in | Wt 166.4 lb

## 2017-10-10 DIAGNOSIS — L089 Local infection of the skin and subcutaneous tissue, unspecified: Secondary | ICD-10-CM | POA: Diagnosis not present

## 2017-10-10 LAB — CBC WITH DIFFERENTIAL/PLATELET
BASOS ABS: 0 10*3/uL (ref 0.0–0.1)
BASOS PCT: 0.7 % (ref 0.0–3.0)
EOS PCT: 2.5 % (ref 0.0–5.0)
Eosinophils Absolute: 0.2 10*3/uL (ref 0.0–0.7)
HEMATOCRIT: 47.2 % (ref 39.0–52.0)
HEMOGLOBIN: 15.9 g/dL (ref 13.0–17.0)
LYMPHS PCT: 30.1 % (ref 12.0–46.0)
Lymphs Abs: 2 10*3/uL (ref 0.7–4.0)
MCHC: 33.7 g/dL (ref 30.0–36.0)
MCV: 92.9 fl (ref 78.0–100.0)
Monocytes Absolute: 0.6 10*3/uL (ref 0.1–1.0)
Monocytes Relative: 9.3 % (ref 3.0–12.0)
Neutro Abs: 3.7 10*3/uL (ref 1.4–7.7)
Neutrophils Relative %: 57.4 % (ref 43.0–77.0)
Platelets: 275 10*3/uL (ref 150.0–400.0)
RBC: 5.08 Mil/uL (ref 4.22–5.81)
RDW: 13.7 % (ref 11.5–15.5)
WBC: 6.5 10*3/uL (ref 4.0–10.5)

## 2017-10-10 MED ORDER — CEPHALEXIN 500 MG PO CAPS: 500.0000 mg | ORAL_CAPSULE | Freq: Two times a day (BID) | ORAL | 0 refills | Status: DC

## 2017-10-10 NOTE — Progress Notes (Signed)
Subjective:    Patient ID: Juan Collier, male    DOB: Apr 12, 1971, 47 y.o.   MRN: [id number]  HPI  Pt in states he feels has small bump Ieft groin area. Area has been there for 6 months. He states the area will wax in size. Also waxes and wanes the way it feels on palpation. Some soft and other times feels hard to palpation. No change in skin color.  With physical activity he wonders if it feels harder?     Review of Systems  Constitutional: Negative for chills, fatigue and fever.  HENT: Negative for dental problem.   Respiratory: Negative for cough, chest tightness, shortness of breath and wheezing.   Cardiovascular: Negative for chest pain and palpitations.  Musculoskeletal: Negative for back pain and gait problem.       See hpi.  Skin:       See hpi.  Neurological: Negative for dizziness, light-headedness and headaches.  Hematological: Negative for adenopathy. Does not bruise/bleed easily.  Psychiatric/Behavioral: Negative for dysphoric mood.   Past Medical History:  Diagnosis Date  . Allergy   . History of fainting spells of unknown cause   . History of stomach ulcers   . Rheumatic fever   . Tuberculosis positve      Social History   Socioeconomic History  . Marital status: Married    Spouse name: Not on file  . Number of children: Not on file  . Years of education: Not on file  . Highest education level: Not on file  Occupational History  . Not on file  Social Needs  . Financial resource strain: Not on file  . Food insecurity:    Worry: Not on file    Inability: Not on file  . Transportation needs:    Medical: Not on file    Non-medical: Not on file  Tobacco Use  . Smoking status: Never Smoker  . Smokeless tobacco: Never Used  Substance and Sexual Activity  . Alcohol use: Yes    Alcohol/week: 1.2 oz    Types: 2 Cans of beer per week  . Drug use: No  . Sexual activity: Yes    Birth control/protection: None  Lifestyle  . Physical activity:    Days  per week: Not on file    Minutes per session: Not on file  . Stress: Not on file  Relationships  . Social connections:    Talks on phone: Not on file    Gets together: Not on file    Attends religious service: Not on file    Active member of club or organization: Not on file    Attends meetings of clubs or organizations: Not on file    Relationship status: Not on file  . Intimate partner violence:    Fear of current or ex partner: Not on file    Emotionally abused: Not on file    Physically abused: Not on file    Forced sexual activity: Not on file  Other Topics Concern  . Not on file  Social History Narrative  . Not on file    No past surgical history on file.  Family History  Problem Relation Age of Onset  . Thyroid disease Daughter   . ADD / ADHD Son   . Bipolar disorder Maternal Grandmother     Allergies  Allergen Reactions  . Mold Extract [Trichophyton]   . [facility] [Quercus Robur]   . Pollen Extract     No current outpatient  medications on file prior to visit.   No current facility-administered medications on file prior to visit.     BP 111/71   Pulse (!) 58   Temp 97.7 F (36.5 C) (Oral)   Resp 16   Ht 5\' 8"  (1.727 m)   Wt 166 lb 6.4 oz (75.5 kg)   SpO2 100%   BMI 25.30 kg/m       Objective:   Physical Exam  General- No acute distress. Pleasant patient. Neck- Full range of motion, no jvd Lungs- Clear, even and unlabored. Heart- regular rate and rhythm. Neurologic- CNII- XII grossly intact.  Genital exam- small pea sized lump.Top of suprapubic area. Left of midline.       Assessment & Plan:  Skin infection mild with small lymph node enlargement vs sebacious cyst. Also lipoma is possible in differential.   Initial reluctant to use antibiotic but in end agreed to use keflex. Will give low dose for 7 days.  Take probiotics while on antibiotic.   Get cbc today.  If after antibiotic no change in area/small lump refer to dermatologist for  evaluation.  Follow up in 7-10 days or as needed  [employer], [employer]

## 2017-10-10 NOTE — Patient Instructions (Signed)
Skin infection mild with small lymph node enlargement vs sebacious cyst. Also lipoma is possible in differential.   Initial reluctant to use antibiotic but in end agreed to use keflex. Will give low dose for 7 days.  Take probiotics while on antibiotic.   Get cbc today.  If after antibiotic no change in area/small lump refer to dermatologist for evaluation.  Follow up in 7-10 days or as needed

## 2018-02-03 ENCOUNTER — Encounter: Payer: Self-pay | Admitting: Medical

## 2018-02-03 ENCOUNTER — Ambulatory Visit (HOSPITAL_BASED_OUTPATIENT_CLINIC_OR_DEPARTMENT_OTHER)
Admission: RE | Admit: 2018-02-03 | Discharge: 2018-02-03 | Disposition: A | Discharge status: Home / Self Care | Payer: BC Managed Care – PPO | Source: Ambulatory Visit | Attending: Medical | Admitting: Medical

## 2018-02-03 ENCOUNTER — Ambulatory Visit: Payer: BC Managed Care – PPO | Admitting: Medical

## 2018-02-03 VITALS — BP 112/66 | HR 53 | Temp 98.1°F | Resp 16 | Ht 68.0 in | Wt 168.6 lb

## 2018-02-03 DIAGNOSIS — M25511 Pain in right shoulder: Secondary | ICD-10-CM

## 2018-02-03 DIAGNOSIS — R2231 Localized swelling, mass and lump, right upper limb: Secondary | ICD-10-CM

## 2018-02-03 DIAGNOSIS — M5431 Sciatica, right side: Secondary | ICD-10-CM

## 2018-02-03 DIAGNOSIS — M2550 Pain in unspecified joint: Secondary | ICD-10-CM | POA: Diagnosis not present

## 2018-02-03 LAB — COMPREHENSIVE METABOLIC PANEL
ALT: 16 U/L (ref 0–53)
AST: 20 U/L (ref 0–37)
Albumin: 4.5 g/dL (ref 3.5–5.2)
Alkaline Phosphatase: 45 U/L (ref 39–117)
BILIRUBIN TOTAL: 0.7 mg/dL (ref 0.2–1.2)
BUN: 17 mg/dL (ref 6–23)
CALCIUM: 9.8 mg/dL (ref 8.4–10.5)
CHLORIDE: 104 meq/L (ref 96–112)
CO2: 28 meq/L (ref 19–32)
Creatinine, Ser: 0.83 mg/dL (ref 0.40–1.50)
GFR: 104.63 mL/min (ref >60.00)
GLUCOSE: 100 mg/dL — AB (ref 70–99)
POTASSIUM: 4.4 meq/L (ref 3.5–5.1)
Sodium: 137 mEq/L (ref 135–145)
Total Protein: 7.3 g/dL (ref 6.0–8.3)

## 2018-02-03 LAB — SEDIMENTATION RATE: SED RATE: 17 mm/h — AB (ref 0–15)

## 2018-02-03 LAB — C-REACTIVE PROTEIN: CRP: 0.3 mg/dL — AB (ref 0.5–20.0)

## 2018-02-03 NOTE — Patient Instructions (Signed)
For your recurrent, constant sciatica type pain when you drive any significant distance, I am going to refer you to sports medicine MD.  This is been going on for quite some time and now present for about 4 weeks so do want to get specialist opinion.  For recent arthralgias, place labs today.  Those labs are sed rate, rheumatoid factor, ANA and CRP.  For recent right shoulder pain and lump at the junction of shoulder/humerus, I did place x-rays of those areas.  Please get x-ray today.  For occasional pain that is moderate, recommend that she use ibuprofen 400 to 600mg  every 8 hours as needed.  Follow-up date to be determined after lab and x-ray review

## 2018-02-03 NOTE — Progress Notes (Signed)
Subjective:    Patient ID: Juan Collier, male    DOB: 05-02-1971, 47 y.o.   MRN: [id number]  HPI  Pt in with some rt side leg pain. He states it is happening when he drives. In the past he was having pain when driving. Pain used to be on and off for years. But now over past month the pain is steadily recurrent when he is driving. Pt states he has been to PT in the past and they did help with former symptoms. He is not reporting pain directly in lumbar spine. Pt state that eventually with pain his rt leg will feel numb.  Pt uses different cars with work but does not make a difference.  Pt also has small knot on his rt upper arm. He states present for about 3 weeks. No pain.   Also describes some all over getting some diffuse joint pain on and off. Wrist, knee and knees.     Review of Systems  Constitutional: Negative for chills, fatigue and fever.  Respiratory: Negative for cough, chest tightness, shortness of breath and wheezing.   Cardiovascular: Negative for chest pain and palpitations.  Gastrointestinal: Negative for abdominal pain.  Musculoskeletal: Positive for arthralgias.       Shoulder pain.  See hpi.  Skin: Negative for rash.  Neurological: Negative for dizziness, syncope, speech difficulty, weakness, numbness and headaches.  Hematological: Negative for adenopathy. Does not bruise/bleed easily.  Psychiatric/Behavioral: Negative for behavioral problems and confusion.   Past Medical History:  Diagnosis Date  . Allergy   . History of fainting spells of unknown cause   . History of stomach ulcers   . Rheumatic fever   . Tuberculosis positve      Social History   Socioeconomic History  . Marital status: Married    Spouse name: Not on file  . Number of children: Not on file  . Years of education: Not on file  . Highest education level: Not on file  Occupational History  . Not on file  Social Needs  . Financial resource strain: Not on file  . Food insecurity:      Worry: Not on file    Inability: Not on file  . Transportation needs:    Medical: Not on file    Non-medical: Not on file  Tobacco Use  . Smoking status: Never Smoker  . Smokeless tobacco: Never Used  Substance and Sexual Activity  . Alcohol use: Yes    Alcohol/week: 2.0 standard drinks    Types: 2 Cans of beer per week  . Drug use: No  . Sexual activity: Yes    Birth control/protection: None  Lifestyle  . Physical activity:    Days per week: Not on file    Minutes per session: Not on file  . Stress: Not on file  Relationships  . Social connections:    Talks on phone: Not on file    Gets together: Not on file    Attends religious service: Not on file    Active member of club or organization: Not on file    Attends meetings of clubs or organizations: Not on file    Relationship status: Not on file  . Intimate partner violence:    Fear of current or ex partner: Not on file    Emotionally abused: Not on file    Physically abused: Not on file    Forced sexual activity: Not on file  Other Topics Concern  . Not on  file  Social History Narrative  . Not on file    No past surgical history on file.  Family History  Problem Relation Age of Onset  . Thyroid disease Daughter   . ADD / ADHD Son   . Bipolar disorder Maternal Grandmother     Allergies  Allergen Reactions  . Mold Extract [Trichophyton]   . [facility] [Quercus Robur]   . Pollen Extract     No current outpatient medications on file prior to visit.   No current facility-administered medications on file prior to visit.     BP 112/66   Pulse (!) 53   Temp 98.1 F (36.7 C) (Oral)   Resp 16   Ht 5\' 8"  (1.727 m)   Wt 168 lb 9.6 oz (76.5 kg)   SpO2 100%   BMI 25.64 kg/m       Objective:   Physical Exam  General Mental Status- Alert. General Appearance- Not in acute distress.   Skin General: Color- Normal Color. Moisture- Normal Moisture.  Neck Carotid Arteries- Normal color. Moisture-  Normal Moisture. No carotid bruits. No JVD.  Chest and Lung Exam Auscultation: Breath Sounds:-Normal.  Cardiovascular Auscultation:Rythm- Regular. Murmurs & Other Heart Sounds:Auscultation of the heart reveals- No Murmurs.  Abdomen Inspection:-Inspeection Normal. Palpation/Percussion:Note:No mass. Palpation and Percussion of the abdomen reveal- Non Tender, Non Distended + BS, no rebound or guarding.    Neurologic Cranial Nerve exam:- CN III-XII intact(No nystagmus), symmetric smile. tStrength:- 5/5 equal and symmetric strength both upper and lower extremities.   Back No Mid lumbar spine tenderness to palpation. No Pain on straight leg lift. No Pain on lateral movements and flexion/extension of the spine.  Lower ext neurologic  L5-S1 sensation intact bilaterally. Normal patellar reflexes bilaterally. No foot drop bilaterally.   Rt shoulder- no pain on range of motion tendernes. Small lump at junction of shoulder and humerus.     Assessment & Plan:  For your recurrent, constant sciatica type pain when you drive any significant distance, I am going to refer you to sports medicine MD.  This is been going on for quite some time and now present for about 4 weeks so do want to get specialist opinion.  For recent arthralgias, place labs today.  Those labs are sed rate, rheumatoid factor, ANA and CRP.  For recent right shoulder pain and lump at the junction of shoulder/humerus, I did place x-rays of those areas.  Please get x-ray today.  For occasional pain that is moderate, recommend that she use ibuprofen 400 to 600mg  every 8 hours as needed.  Follow-up date to be determined after lab and x-ray review  Mackie Pai, PA-C

## 2018-02-06 LAB — ANA: Anti Nuclear Antibody(ANA): NEGATIVE

## 2018-02-06 LAB — RHEUMATOID FACTOR

## 2018-02-25 ENCOUNTER — Encounter: Payer: Self-pay | Admitting: Family Medicine

## 2018-02-25 ENCOUNTER — Ambulatory Visit: Payer: BC Managed Care – PPO | Admitting: Family Medicine

## 2018-02-25 ENCOUNTER — Ambulatory Visit: Payer: BC Managed Care – PPO | Attending: Family Medicine | Admitting: Physical Therapy

## 2018-02-25 ENCOUNTER — Other Ambulatory Visit: Payer: Self-pay

## 2018-02-25 ENCOUNTER — Encounter: Payer: Self-pay | Admitting: Physical Therapy

## 2018-02-25 VITALS — BP 118/78 | HR 55 | Ht 68.0 in | Wt 165.0 lb

## 2018-02-25 DIAGNOSIS — R252 Cramp and spasm: Secondary | ICD-10-CM | POA: Insufficient documentation

## 2018-02-25 DIAGNOSIS — M25551 Pain in right hip: Secondary | ICD-10-CM | POA: Insufficient documentation

## 2018-02-25 DIAGNOSIS — M5441 Lumbago with sciatica, right side: Secondary | ICD-10-CM

## 2018-02-25 DIAGNOSIS — R29898 Other symptoms and signs involving the musculoskeletal system: Secondary | ICD-10-CM | POA: Insufficient documentation

## 2018-02-25 NOTE — Progress Notes (Signed)
PCP: Copland, Gay Filler, MD Consultation requested by Mackie Pai PA-C  Subjective:   HPI: Patient is a 47 y.o. male here for posterior hip pain/back pain.  Patient reports he's had several years of off and on low back pain that intermittently bothered him with driving. Past 3 months pain has become more constant. Still worse primarily with driving but feels pain mostly in right buttock/low back pain into thigh and calf. Pain level 0/10 at rest but up to 8-9/10 and sharp with driving. Pain is a squeezing, pressing as well going into his right leg posteriorly through calf. Bothers pushing on the pedal. No problems running. Physical therapy helped previously about 8-9 years ago. No skin changes, numbness. No bowel/bladder dysfunction.  Past Medical History:  Diagnosis Date  . Allergy   . History of fainting spells of unknown cause   . History of stomach ulcers   . Rheumatic fever   . Tuberculosis positve     No current outpatient medications on file prior to visit.   No current facility-administered medications on file prior to visit.     History reviewed. No pertinent surgical history.  Allergies  Allergen Reactions  . Mold Extract [Trichophyton]   . [facility] [Quercus Robur]   . Pollen Extract     Social History   Socioeconomic History  . Marital status: Married    Spouse name: Not on file  . Number of children: Not on file  . Years of education: Not on file  . Highest education level: Not on file  Occupational History  . Not on file  Social Needs  . Financial resource strain: Not on file  . Food insecurity:    Worry: Not on file    Inability: Not on file  . Transportation needs:    Medical: Not on file    Non-medical: Not on file  Tobacco Use  . Smoking status: Never Smoker  . Smokeless tobacco: Never Used  Substance and Sexual Activity  . Alcohol use: Yes    Alcohol/week: 2.0 standard drinks    Types: 2 Cans of beer per week  . Drug use: No  .  Sexual activity: Yes    Birth control/protection: None  Lifestyle  . Physical activity:    Days per week: Not on file    Minutes per session: Not on file  . Stress: Not on file  Relationships  . Social connections:    Talks on phone: Not on file    Gets together: Not on file    Attends religious service: Not on file    Active member of club or organization: Not on file    Attends meetings of clubs or organizations: Not on file    Relationship status: Not on file  . Intimate partner violence:    Fear of current or ex partner: Not on file    Emotionally abused: Not on file    Physically abused: Not on file    Forced sexual activity: Not on file  Other Topics Concern  . Not on file  Social History Narrative  . Not on file    Family History  Problem Relation Age of Onset  . Thyroid disease Daughter   . ADD / ADHD Son   . Bipolar disorder Maternal Grandmother     BP 118/78   Pulse (!) 55   Ht 5\' 8"  (1.727 m)   Wt 165 lb (74.8 kg)   BMI 25.09 kg/m   Review of Systems: See HPI  above.     Objective:  Physical Exam:  Gen: NAD, comfortable in exam room  Back: No gross deformity, scoliosis. No paraspinal TTP .  No midline or bony TTP. FROM. Strength LEs 5/5 all muscle groups.   2+ MSRs in patellar and achilles tendons, equal bilaterally. Negative SLRs. Sensation intact to light touch bilaterally.  Right hip: No deformity. FROM with 5/5 strength including hip abduction TTP over piriformis.  No other tenderness. NVI distally. Negative logroll Negative fabers and piriformis stretches.   Assessment & Plan:  1. Right hip pain - consistent with piriformis syndrome, less likely lumbar radiculopathy given location.  Shown home exercises and stretches to do daily.  Has done well in past with physical therapy - will repeat this.  Massage.  Ibuprofen or aleve if needed.  F/u in 6 weeks.  After visit patient asked about the mass on his right upper arm - no symptoms with  this, states has been there for a while and has recently decreased in size.  Someone noticed this recently when running.  On exam mass is well circumscribed but less mobile than would expect for a lipoma.  On ultrasound, noted striations but mass is homogeneous without evidence necrotic areas, calcification, or neovascularity; no concerning features.  Advised reevaluating in about 3-6 months, call sooner if gets larger or develops symptoms with this which I expect would be unusual.

## 2018-02-25 NOTE — Patient Instructions (Signed)
You have piriformis syndrome. Try to avoid painful activities when possible (hills, stairs, prolonged sitting). Pick 2-3 stretches where you feel the pull in the area of pain - do 3 of these and hold for 20-30 seconds twice a day. Standing hip rotations, hip side raises 3 sets of 10 once a day. Ibuprofen 600mg  three times a day with food OR aleve 2 tabs twice a day with food for pain and inflammation as needed. Tennis ball to massage area when sitting may help. Start physical therapy. Follow up with me in 6 weeks.

## 2018-02-25 NOTE — Therapy (Signed)
[facility] [address]  [place], [place], [address] Phone: [phone]   Fax:  [phone]  Physical Therapy Evaluation  Patient Details  Name: Juan Collier MRN: [id number] Date of Birth: [birth year] Referring Provider (PT): Karlton Lemon, MD   Encounter Date: 02/25/2018  PT End of Session - 02/25/18 1105    Visit Number  1    Number of Visits  8    Date for PT Re-Evaluation  03/25/18    Authorization Type  BCBS    PT Start Time  1105    PT Stop Time  1208    PT Time Calculation (min)  63 min    Activity Tolerance  Patient tolerated treatment well    Behavior During Therapy  [facility] for tasks assessed/performed       Past Medical History:  Diagnosis Date  . Allergy   . History of fainting spells of unknown cause   . History of stomach ulcers   . Rheumatic fever   . Tuberculosis positve     History reviewed. No pertinent surgical history.  There were no vitals filed for this visit.   Subjective Assessment - 02/25/18 1108    Subjective  Pt reports feeling of pressure in mid buttock down back of R leg while sitting, esp while driving, which has been worsening over past 2 years. Initially able to get relief from positional changes, but now has been constant for past 3 months. Also noting new onset in L buttock w/o radicular symptoms.    Limitations  Sitting;Standing    How long can you sit comfortably?  unlimited if sitting in anterior pelvic tilt; 3 minutes with posterior pelvic tilt such as when semi-reclined while driving    How long can you stand comfortably?  varies - sometimes notes pressure after 4-5 minutes; other time - no issues    Patient Stated Goals  "to be able to drive w/o pain"    Currently in Pain?  Yes    Pain Score  0-No pain   up to 10/10 at worst   Pain Location  Buttocks    Pain Orientation  Right;Left   R > L   Pain Descriptors / Indicators  Constant;Pressure;Squeezing    Pain Type   Acute pain;Chronic pain    Pain Radiating Towards  pressure down back of R leg to mid-calf - denies numbness or tingling    Pain Onset  More than a month ago    Pain Frequency  Intermittent    Aggravating Factors   sitting, esp in reclined position    Pain Relieving Factors  repositioning, ibuprofen    Effect of Pain on Daily Activities  limits driving tolerance         [facility] PT Assessment - 02/25/18 1105      Assessment   Medical Diagnosis  R piriformis syndrome    Referring Provider (PT)  Karlton Lemon, MD    Onset Date/Surgical Date  --   exacerbation x 3 months, but pain up to 2 yrs   Next MD Visit  04/22/18    Prior Therapy  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  2   while trail running   Has the patient had a decrease in activity level because of a fear of falling?   No  Is the patient reluctant to leave their home because of a fear of falling?   No      Home Social worker  Private residence    Living Arrangements  Spouse/significant other;Children    Type of [place] to enter    Entrance Stairs-Number of Steps  12   at front entrance   [place]  Two level;Able to live on main level with bedroom/bathroom      Prior Function   Level of Independence  Independent    Vocation  Full time employment    Vocation Requirements  Work from home + travel 1-2x/wk by car typically no more than 3.5 hrs    Leisure  running 6 days/wk (up to 20-30 miles/wk); daily stretches      Cognition   Overall Cognitive Status  Within Functional Limits for tasks assessed      Observation/Other Assessments   Focus on Therapeutic Outcomes (FOTO)   83% (17% limitation); predicted 82% (18 limitation)      ROM / Strength   AROM / PROM / Strength  AROM;Strength      AROM   AROM Assessment Site  Lumbar    Lumbar Flexion   WNL - hands flat on floor    Lumbar Extension  WNL    Lumbar - Right Side Bend  hand to lateral joint line    Lumbar - Left Side Bend  hand to lateral joint line    Lumbar - Right Rotation  WNL    Lumbar - Left Rotation  WNL      Strength   Strength Assessment Site  Hip;Knee    Right/Left Hip  Right;Left    Right Hip Flexion  5/5    Right Hip Extension  4/5    Right Hip External Rotation   4-/5    Right Hip Internal Rotation  4+/5    Right Hip ABduction  5/5    Right Hip ADduction  5/5    Left Hip Flexion  5/5    Left Hip Extension  4/5    Left Hip External Rotation  4/5    Left Hip Internal Rotation  4+/5    Left Hip ABduction  5/5    Left Hip ADduction  5/5    Right/Left Knee  Right;Left    Right Knee Flexion  5/5    Right Knee Extension  5/5    Left Knee Flexion  5/5    Left Knee Extension  5/5      Flexibility   Soft Tissue Assessment /Muscle Length  yes    Hamstrings  mild/mod tight R>L    Quadriceps  mild tight B    ITB  mod tight R>L    Piriformis  mild tight B      Palpation   SI assessment   apparent R anterior innominate rotation of pelvis on sacrum - corrected with MET    Palpation comment  ttp over B piriformis (R>L), glute minimus to a lesser extent      Special Tests    Special Tests  Hip Special Tests    Hip Special Tests   Ober's Test;Piriformis Test      Ober's Test   Findings  Positive    Side  Right    Comments  tightness      Piriformis Test   Findings  Positive    Side  Right    Comments  pain                Objective measurements completed on examination: See above findings.      [facility] Adult PT Treatment/Exercise - 02/25/18 1105      Exercises   Exercises  Knee/Hip      Knee/Hip Exercises: Stretches   Passive Hamstring Stretch  Right;30 seconds;2 reps    Passive Hamstring Stretch Limitations  pt shown mutiple options in supine & sitting    ITB Stretch  Right;30 seconds;2 reps    ITB Stretch Limitations  pt shown  mutiple options in supine, sidelying & standing    Piriformis Stretch  Right;30 seconds;2 reps    Piriformis Stretch Limitations  pt shown mutiple options in supine & longsitting      Knee/Hip Exercises: Supine   Bridges with Clamshell  Both;10 reps   + alt hip ABD/ER with green TB     Knee/Hip Exercises: Sidelying   Clams  B clam with green TB x10             PT Education - 02/25/18 1205    Education Details  PT eval findings, anticipated POC & initial HEP (mutiple options provided for stretches with pt instructed to choose most effective 1-2 for HEP)    Person(s) Educated  Patient    Methods  Explanation;Demonstration;Handout    Comprehension  Verbalized understanding;Returned demonstration;Need further instruction          PT Long Term Goals - 02/25/18 1208      PT LONG TERM GOAL #1   Title  Independent with ongoing HEP    Status  New    Target Date  03/25/18      PT LONG TERM GOAL #2   Title  Patient to demonstrate improved proximal LE tissue quality and pliability with reduced pain    Status  New    Target Date  03/25/18      PT LONG TERM GOAL #3   Title  Patient will report ability to drive for >/= 1 hr w/o limitation due to R buttock pain    Status  New    Target Date  03/25/18             Plan - 02/25/18 1219    Clinical Impression Statement  Tlaloc is a 47 y/o male who presents to OP PT for R buttock and LE pain secondary to piriformis syndrome. Pt reports pain onset ~2 yrs ago, worsening since onset with pain now constant for past 3 months. Pain primarily with sitting for any length of time, especially when sitting in position of posterior pelvic tilt as when reclined or sitting back in a car seat while driving. Lumbar ROM WFL with very mild restriction in B side-bending due to tightness with mild to moderate limitations in proximal LE flexibility, esp in R ITB. B LE grossly 5/5 with exceptions of B hip extension and ER weakness (R>L). Pain limits his  work tolerance due to limitations in sitting tolerance especially while driving. Ulrick has good potential to benefit from skilled PT to address above deficits and decrease pain interference with sitting and driving.    Clinical Presentation  Stable    Clinical Decision Making  Low    Rehab Potential  Good    PT Frequency  2x / week    PT Duration  4 weeks    PT Treatment/Interventions  Patient/family education;Neuromuscular re-education;Therapeutic exercise;Therapeutic activities;ADLs/Self Care Home Management;Electrical Stimulation;Moist  Heat;Ultrasound;Iontophoresis 73m/ml Dexamethasone;Manual techniques;Dry needling;Taping    Consulted and Agree with Plan of Care  Patient       Patient will benefit from skilled therapeutic intervention in order to improve the following deficits and impairments:  Pain, Increased muscle spasms, Impaired flexibility, Decreased range of motion, Decreased strength, Decreased activity tolerance, Postural dysfunction, Improper body mechanics  Visit Diagnosis: Pain in right hip  Cramp and spasm  Other symptoms and signs involving the musculoskeletal system     Problem List Patient Active Problem List   Diagnosis Date Noted  . Left shoulder pain 07/02/2016  . WRIST PAIN, LEFT 07/13/2010  . BACK PAIN 07/13/2010    JPercival Spanish PT, MPT 02/25/2018, 7:06 PM  [facility] [place] [address]Phone: [phone]  Fax:  [phone] Name: CADEMIDE SCHABERGMRN: [id number] of Birth: [birth year]

## 2018-03-04 ENCOUNTER — Ambulatory Visit: Payer: BC Managed Care – PPO

## 2018-03-04 DIAGNOSIS — R29898 Other symptoms and signs involving the musculoskeletal system: Secondary | ICD-10-CM

## 2018-03-04 DIAGNOSIS — M25551 Pain in right hip: Secondary | ICD-10-CM | POA: Diagnosis not present

## 2018-03-04 DIAGNOSIS — R252 Cramp and spasm: Secondary | ICD-10-CM

## 2018-03-04 NOTE — Therapy (Signed)
[facility] [address]  [place], [place], [address] Phone: [phone]   Fax:  [phone]  Physical Therapy Treatment  Patient Details  Name: Juan Collier MRN: [id number] Date of Birth: 1970-10-02 Referring Provider (PT): Karlton Lemon, MD   Encounter Date: 03/04/2018  PT End of Session - 03/04/18 0805    Visit Number  2    Number of Visits  8    Date for PT Re-Evaluation  03/25/18    Authorization Type  BCBS    PT Start Time  0800    PT Stop Time  0844    PT Time Calculation (min)  44 min    Activity Tolerance  Patient tolerated treatment well    Behavior During Therapy  [facility] for tasks assessed/performed       Past Medical History:  Diagnosis Date  . Allergy   . History of fainting spells of unknown cause   . History of stomach ulcers   . Rheumatic fever   . Tuberculosis positve     No past surgical history on file.  There were no vitals filed for this visit.  Subjective Assessment - 03/04/18 0802    Subjective  Pt. reporting good relief from last visit.      How long can you sit comfortably?  unlimited if sitting in anterior pelvic tilt; 3 minutes with posterior pelvic tilt such as when semi-reclined while driving    How long can you stand comfortably?  varies - sometimes notes pressure after 4-5 minutes; other time - no issues    Patient Stated Goals  "to be able to drive w/o pain"    Currently in Pain?  Yes    Pain Score  2    Up to 10/10 at worst with prolonged sitting   Pain Location  Buttocks    Pain Orientation  Right    Pain Descriptors / Indicators  Constant   "Pinching"   Pain Type  Acute pain;Chronic pain    Pain Radiating Towards  None today     Pain Onset  More than a month ago    Pain Frequency  Intermittent    Aggravating Factors   Prolonged sitting     Pain Relieving Factors  Repositioning    Multiple Pain Sites  No                       [facility] Adult PT  Treatment/Exercise - 03/04/18 0001      Knee/Hip Exercises: Stretches   Passive Hamstring Stretch  Right;30 seconds;2 reps    Passive Hamstring Stretch Limitations  supine with strap     ITB Stretch  Right;30 seconds;2 reps    ITB Stretch Limitations  standing, sidelying, and supine with strpa     Piriformis Stretch  Right;30 seconds;2 reps    Piriformis Stretch Limitations  seated and supine       Knee/Hip Exercises: Aerobic   Recumbent Bike  Lvl 2, 6 min       Knee/Hip Exercises: Standing   Other Standing Knee Exercises  sit<>stand with green TB at knees with isometric hip abd/ER x 15 reps       Knee/Hip Exercises: Supine   Bridges with Clamshell  Both   x 12 rpes      Knee/Hip Exercises: Sidelying   Clams  B clam with green TB x 12      Manual Therapy   Manual Therapy  Soft tissue mobilization;Myofascial release    Manual therapy comments  sidelying with LE resting on bolster     Soft tissue mobilization  STM to R piriformis/glute    Myofascial Release  TPR to R mid piri with good response                  PT Long Term Goals - 03/04/18 0818      PT LONG TERM GOAL #1   Title  Independent with ongoing HEP    Status  On-going      PT LONG TERM GOAL #2   Title  Patient to demonstrate improved proximal LE tissue quality and pliability with reduced pain    Status  On-going      PT LONG TERM GOAL #3   Title  Patient will report ability to drive for >/= 1 hr w/o limitation due to R buttock pain    Status  On-going            Plan - 03/04/18 9371    Clinical Impression Statement  Pt. noting good relief after last therapy session.  This session focusing on HEP review to check for pt. compliance and proper technique with pt. able to verbalize/demonstrate good overall understanding of HEP only requiring mod cueing with standing ITB stretch for understanding.  Initiated gentle proximal glute/piriformis strengthening for hopeful reduction in overall tension and  tone and did respond well to STM/TPR to mid piriformis in area of tenderness today.  Ended visit with moist heat to glutes for hopeful reduction in tone.  Encouraged pt. to continue consistent LE stretches from HEP daily.  Will continue to progress toward goals.      PT Treatment/Interventions  Patient/family education;Neuromuscular re-education;Therapeutic exercise;Therapeutic activities;ADLs/Self Care Home Management;Electrical Stimulation;Moist Heat;Ultrasound;Iontophoresis 4mg /ml Dexamethasone;Manual techniques;Dry needling;Taping    Consulted and Agree with Plan of Care  Patient       Patient will benefit from skilled therapeutic intervention in order to improve the following deficits and impairments:  Pain, Increased muscle spasms, Impaired flexibility, Decreased range of motion, Decreased strength, Decreased activity tolerance, Postural dysfunction, Improper body mechanics  Visit Diagnosis: Pain in right hip  Cramp and spasm  Other symptoms and signs involving the musculoskeletal system     Problem List Patient Active Problem List   Diagnosis Date Noted  . Left shoulder pain 07/02/2016  . WRIST PAIN, LEFT 07/13/2010  . BACK PAIN 07/13/2010    Bess Harvest, PTA 03/04/18 5:43 PM   [facility] [address]  [place], [place], [address] Phone: [phone]   Fax:  [phone]  Name: Juan Collier MRN: [id number] Date of Birth: Jul 27, 1970

## 2018-03-04 NOTE — Patient Instructions (Signed)

## 2018-03-10 ENCOUNTER — Encounter: Payer: Self-pay | Admitting: Physical Therapy

## 2018-03-10 ENCOUNTER — Ambulatory Visit: Payer: BC Managed Care – PPO | Admitting: Physical Therapy

## 2018-03-10 DIAGNOSIS — R29898 Other symptoms and signs involving the musculoskeletal system: Secondary | ICD-10-CM

## 2018-03-10 DIAGNOSIS — M25551 Pain in right hip: Secondary | ICD-10-CM

## 2018-03-10 DIAGNOSIS — R252 Cramp and spasm: Secondary | ICD-10-CM

## 2018-03-10 NOTE — Therapy (Signed)
[facility] [address]  [place], [place], [address] Phone: [phone]   Fax:  [phone]  Physical Therapy Treatment  Patient Details  Name: Juan Collier MRN: [id number] Date of Birth: 03/01/1971 Referring Provider (PT): Karlton Lemon, MD   Encounter Date: 03/10/2018  PT End of Session - 03/10/18 0850    Visit Number  3    Number of Visits  8    Date for PT Re-Evaluation  03/25/18    Authorization Type  BCBS    PT Start Time  0850    PT Stop Time  0947    PT Time Calculation (min)  57 min    Activity Tolerance  Patient tolerated treatment well    Behavior During Therapy  [facility] for tasks assessed/performed       Past Medical History:  Diagnosis Date  . Allergy   . History of fainting spells of unknown cause   . History of stomach ulcers   . Rheumatic fever   . Tuberculosis positve     History reviewed. No pertinent surgical history.  There were no vitals filed for this visit.  Subjective Assessment - 03/10/18 0853    Subjective  Pt feels that HEP stretches are helping but do not give lasting relief.    Patient Stated Goals  "to be able to drive w/o pain"    Currently in Pain?  Yes    Pain Score  0-No pain   7-8/10 before stretching   Pain Location  Buttocks    Pain Orientation  Right    Pain Descriptors / Indicators  Tightness   "pinching"   Pain Type  Acute pain;Chronic pain    Pain Frequency  Intermittent                       [facility] Adult PT Treatment/Exercise - 03/10/18 0850      Exercises   Exercises  Knee/Hip      Knee/Hip Exercises: Stretches   Passive Hamstring Stretch  Right;30 seconds;2 reps    Passive Hamstring Stretch Limitations  supine with strap     ITB Stretch  Right;30 seconds;2 reps    ITB Stretch Limitations  supine with strap    Piriformis Stretch  Right;30 seconds;2 reps    Piriformis Stretch Limitations  supine KTOS & figure 4 to chest      Knee/Hip Exercises: Aerobic   Recumbent Bike  L2 x 6 min      Knee/Hip Exercises: Standing   Other Standing Knee Exercises  B side-stepping & fwd/back monster walk with red TB 2 x 47ft      Modalities   Modalities  Electrical Stimulation;Moist Heat      Moist Heat Therapy   Number Minutes Moist Heat  15 Minutes    Moist Heat Location  Hip   R buttocks     Electrical Stimulation   Electrical Stimulation Location  R buttocks    Electrical Stimulation Action  IFC    Electrical Stimulation Parameters  80-150 Hz, intensity to pt tol x15'    Electrical Stimulation Goals  Pain;Tone      Manual Therapy   Manual Therapy  Soft tissue mobilization;Myofascial release    Manual therapy comments  prone    Soft tissue mobilization  STM to R piriformis/glute    Myofascial Release  manual TPR to R glute minimus & mid piriformis with good response  Trigger Point Dry Needling - 03/10/18 0850    Consent Given?  Yes    Education Handout Provided  Yes    Muscles Treated Lower Body  Piriformis;Gluteus minimus;Gluteus maximus    Gluteus Maximus Response  Twitch response elicited;Palpable increased muscle length    Gluteus Minimus Response  Twitch response elicited;Palpable increased muscle length    Piriformis Response  Twitch response elicited;Palpable increased muscle length                PT Long Term Goals - 03/04/18 0818      PT LONG TERM GOAL #1   Title  Independent with ongoing HEP    Status  On-going      PT LONG TERM GOAL #2   Title  Patient to demonstrate improved proximal LE tissue quality and pliability with reduced pain    Status  On-going      PT LONG TERM GOAL #3   Title  Patient will report ability to drive for >/= 1 hr w/o limitation due to R buttock pain    Status  On-going            Plan - 03/10/18 0856    Clinical Impression Statement  Juan Collier reporting good temporary relief of tensiion/tightness from HEP stretches but does not feel that this  gives him lasting relief. Increased muscle tension and taut bands evident in R piriformis & glutes - addressed with manual therapy including DN after informed pt consent with positive twitch response and decreased tension noted by pt & PT following this. Manual therapy followed by stretching and strengthening exercises to further promote normalization of muscle tension. Treatment concluded with estim and moist heat to promote further muscle relaxation and will assess response on next visit.    Rehab Potential  Good    PT Treatment/Interventions  Patient/family education;Neuromuscular re-education;Therapeutic exercise;Therapeutic activities;ADLs/Self Care Home Management;Electrical Stimulation;Moist Heat;Ultrasound;Iontophoresis 4mg /ml Dexamethasone;Manual techniques;Dry needling;Taping    Consulted and Agree with Plan of Care  Patient       Patient will benefit from skilled therapeutic intervention in order to improve the following deficits and impairments:  Pain, Increased muscle spasms, Impaired flexibility, Decreased range of motion, Decreased strength, Decreased activity tolerance, Postural dysfunction, Improper body mechanics  Visit Diagnosis: Pain in right hip  Cramp and spasm  Other symptoms and signs involving the musculoskeletal system     Problem List Patient Active Problem List   Diagnosis Date Noted  . Left shoulder pain 07/02/2016  . WRIST PAIN, LEFT 07/13/2010  . BACK PAIN 07/13/2010    Percival Spanish, PT, MPT 03/10/2018, 3:30 PM  [facility] [address]  [place], [place], [address] Phone: [phone]   Fax:  [phone]  Name: Juan Collier MRN: [id number] Date of Birth: December 18, 1970

## 2018-03-19 ENCOUNTER — Ambulatory Visit: Payer: BC Managed Care – PPO | Admitting: Physical Therapy

## 2018-03-19 ENCOUNTER — Encounter: Payer: Self-pay | Admitting: Physical Therapy

## 2018-03-19 DIAGNOSIS — M25551 Pain in right hip: Secondary | ICD-10-CM | POA: Diagnosis not present

## 2018-03-19 DIAGNOSIS — R29898 Other symptoms and signs involving the musculoskeletal system: Secondary | ICD-10-CM

## 2018-03-19 DIAGNOSIS — R252 Cramp and spasm: Secondary | ICD-10-CM

## 2018-03-19 NOTE — Therapy (Addendum)
[facility] [address]  [place], [place], [address] Phone: [phone]   Fax:  [phone]  Physical Therapy Treatment  Patient Details  Name: Juan Collier MRN: [id number] Date of Birth: Aug 19, 1970 Referring Provider (PT): Karlton Lemon, MD   Encounter Date: 03/19/2018  PT End of Session - 03/19/18 0806    Visit Number  4    Number of Visits  8    Date for PT Re-Evaluation  03/25/18    Authorization Type  BCBS    PT Start Time  0806   Pt arrived late   PT Stop Time  0844    PT Time Calculation (min)  38 min    Activity Tolerance  Patient tolerated treatment well    Behavior During Therapy  [facility] for tasks assessed/performed       Past Medical History:  Diagnosis Date  . Allergy   . History of fainting spells of unknown cause   . History of stomach ulcers   . Rheumatic fever   . Tuberculosis positve     History reviewed. No pertinent surgical history.  There were no vitals filed for this visit.  Subjective Assessment - 03/19/18 0808    Subjective  Pt reports DN took away the burning and pain down the leg. Went running the same day and felt "lighter". No longer has pain when he drives, but still feels sensation of "heat" in the buttock.    Patient Stated Goals  "to be able to drive w/o pain"    Currently in Pain?  No/denies                       [facility] Adult PT Treatment/Exercise - 03/19/18 0806      Exercises   Exercises  Knee/Hip      Knee/Hip Exercises: Aerobic   Recumbent Bike  L3 x 7 min      Manual Therapy    Manual Therapy  Soft tissue mobilization;Myofascial release    Manual therapy comments  prone    Soft tissue mobilization  STM to R piriformis/glute    Myofascial Release  manual TPR to R glute minimus & mid piriformis with good response        Trigger Point Dry Needling - 03/19/18 0806    Consent Given?  Yes    Muscles Treated Lower Body   Piriformis;Gluteus minimus;Gluteus maximus       Gluteus Maximus Response     Twitch response elicited;Palpable increased muscle length   Gluteus Minimus Response  Twitch response elicited;Palpable increased muscle length    Piriformis Response  Twitch response elicited;Palpable increased muscle length          PT Education - 03/19/18 0844    Education Details  TENS unit info    Person(s) Educated  Patient    Methods  Explanation;Handout;Demonstration    Comprehension  Verbalized understanding          PT Long Term Goals - 03/04/18 0818      PT LONG TERM GOAL #1   Title  Independent with ongoing HEP    Status  On-going      PT LONG TERM GOAL #2   Title  Patient to demonstrate improved proximal LE tissue quality and pliability with reduced pain    Status  On-going      PT LONG TERM GOAL #3   Title  Patient will report ability to drive for >/= 1  hr w/o limitation due to R buttock pain    Status  On-going            Plan - 03/19/18 0808    Clinical Impression Statement  Clifton James reporting good relief from from manual therapy, esp DN, last session - noting no R LE radicular pain while driving (his primary complaint), only feeling of "heat" in R buttock. Also noting "lighter" feeling while running, but did note more awareness of tightness in L buttock than he had previously been aware of. R glute and piriformis muscle tension reduced from last visit but ongoing tight bands noted in R glutes & B piriformis which were again address with manual therapy & DN with good twitch response elicited and palpable decrease in muscle tension noted by end of session. Pt provided with info on obtaining a home TENS unit for use PRN to address any remaining pain/irritation.    Rehab Potential  Good    PT Treatment/Interventions  Patient/family education;Neuromuscular re-education;Therapeutic exercise;Therapeutic activities;ADLs/Self Care Home Management;Electrical Stimulation;Moist  Heat;Ultrasound;Iontophoresis 4mg /ml Dexamethasone;Manual techniques;Dry needling;Taping    Consulted and Agree with Plan of Care  Patient       Patient will benefit from skilled therapeutic intervention in order to improve the following deficits and impairments:  Pain, Increased muscle spasms, Impaired flexibility, Decreased range of motion, Decreased strength, Decreased activity tolerance, Postural dysfunction, Improper body mechanics  Visit Diagnosis: Pain in right hip  Cramp and spasm  Other symptoms and signs involving the musculoskeletal system     Problem List Patient Active Problem List   Diagnosis Date Noted  . Left shoulder pain 07/02/2016  . WRIST PAIN, LEFT 07/13/2010  . BACK PAIN 07/13/2010    Percival Spanish, PT, MPT 03/19/2018, 10:03 AM  [facility] [address]  [place], [place], [address] Phone: [phone]   Fax:  [phone]  Name: Juan Collier MRN: [id number] Date of Birth: 01-03-1971

## 2018-03-19 NOTE — Patient Instructions (Signed)
TENS UNIT  This is helpful for muscle pain and spasm.   Search and Purchase a TENS 7000 2nd edition at www.tenspros.com or www.amazon.com  (It should be less than $30)     TENS unit instructions:   Do not shower or bathe with the unit on  Turn the unit off before removing electrodes or batteries  If the electrodes lose stickiness add a drop of water to the electrodes after they are disconnected from the unit and place on plastic sheet. If you continued to have difficulty, call the TENS unit company to purchase more electrodes.  Do not apply lotion on the skin area prior to use. Make sure the skin is clean and dry as this will help prolong the life of the electrodes.  After use, always check skin for unusual red areas, rash or other skin difficulties. If there are any skin problems, does not apply electrodes to the same area.  Never remove the electrodes from the unit by pulling the wires.  Do not use the TENS unit or electrodes other than as directed.  Do not change electrode placement without consulting your therapist or physician.  Keep 2 fingers with between each electrode.   TENS stands for Transcutaneous Electrical Nerve Stimulation. In other words, electrical impulses are allowed to pass through the skin in order to excite a nerve.   Purpose and Use of TENS:  TENS is a method used to manage acute and chronic pain without the use of drugs. It has been effective in managing pain associated with surgery, sprains, strains, trauma, rheumatoid arthritis, and neuralgias. It is a non-addictive, low risk, and non-invasive technique used to control pain. It is not, by any means, a curative form of treatment.   How TENS Works:  Most TENS units are a Paramedic unit powered by one 9 volt battery. Attached to the outside of the unit are two lead wires where two pins and/or snaps connect on each wire. All units come with a set of four reusable pads or electrodes. These are placed  on the skin surrounding the area involved. By inserting the leads into  the pads, the electricity can pass from the unit making the circuit complete.  As the intensity is turned up slowly, the electrical current enters the body from the electrodes through the skin to the surrounding nerve fibers. This triggers the release of hormones from within the body. These hormones contain pain relievers. By increasing the circulation of these hormones, the person's pain may be lessened. It is also believed that the electrical stimulation itself helps to block the pain messages being sent to the brain, thus also decreasing the body's perception of pain.   Hazards:  TENS units are NOT to be used by patients with PACEMAKERS, DEFIBRILLATORS, DIABETIC PUMPS, PREGNANT WOMEN, and patients with SEIZURE DISORDERS.  TENS units are NOT to be used over the heart, throat, brain, or spinal cord.  One of the major side effects from the TENS unit may be skin irritation. Some people may develop a rash if they are sensitive to the materials used in the electrodes or the connecting wires.   Wear the unit for 15-30 minutes at a time.   Avoid overuse due the body getting used to the stem making it not as effective over time.

## 2018-03-26 ENCOUNTER — Ambulatory Visit: Payer: BC Managed Care – PPO | Attending: Family Medicine | Admitting: Physical Therapy

## 2018-03-26 ENCOUNTER — Encounter: Payer: Self-pay | Admitting: Physical Therapy

## 2018-03-26 DIAGNOSIS — M25551 Pain in right hip: Secondary | ICD-10-CM | POA: Diagnosis present

## 2018-03-26 DIAGNOSIS — R29898 Other symptoms and signs involving the musculoskeletal system: Secondary | ICD-10-CM | POA: Diagnosis present

## 2018-03-26 DIAGNOSIS — R252 Cramp and spasm: Secondary | ICD-10-CM

## 2018-03-26 NOTE — Therapy (Signed)
[facility] [address]  [place], [place], [address] Phone: [phone]   Fax:  [phone]  Physical Therapy Treatment  Patient Details  Name: Juan Collier MRN: [id number] Date of Birth: 13-Apr-1971 Referring Provider (PT): Karlton Lemon, MD   Encounter Date: 03/26/2018  PT End of Session - 03/26/18 0801    Visit Number  5    Number of Visits  8    Date for PT Re-Evaluation  03/25/18    Authorization Type  BCBS    PT Start Time  0801    PT Stop Time  0905    PT Time Calculation (min)  64 min    Activity Tolerance  Patient tolerated treatment well    Behavior During Therapy  [facility] for tasks assessed/performed       Past Medical History:  Diagnosis Date  . Allergy   . History of fainting spells of unknown cause   . History of stomach ulcers   . Rheumatic fever   . Tuberculosis positve     History reviewed. No pertinent surgical history.  There were no vitals filed for this visit.  Subjective Assessment - 03/26/18 0802    Subjective  Pt reports the sciatica acted up a little last night while driving to/from [employer]. Did mention increased intensity running over weekend while daughter was home visiting.    Patient Stated Goals  "to be able to drive w/o pain"    Currently in Pain?  No/denies    Pain Score  0-No pain   7-8/10 while driving   Pain Location  Buttocks    Pain Orientation  Right    Pain Descriptors / Indicators  Burning;Pressure   "pinching"   Pain Type  Acute pain;Chronic pain    Pain Radiating Towards  none    Pain Frequency  Intermittent                       [facility] Adult PT Treatment/Exercise - 03/26/18 0801      Exercises   Exercises  Knee/Hip      Knee/Hip Exercises: Aerobic   Recumbent Bike  L3 x 6 min      Modalities   Modalities  Electrical Stimulation;Moist Heat      Moist Heat Therapy   Number Minutes Moist Heat  15 Minutes    Moist Heat Location  Hip    R buttock & lateral quads     Electrical Stimulation   Electrical Stimulation Location  R buttock & lateral quads    Electrical Stimulation Action  Pre-mod    Electrical Stimulation Parameters  10-20 Hz, intensity to pt tol x 15'    Electrical Stimulation Goals  Pain;Tone      Manual Therapy   Manual Therapy  Soft tissue mobilization;Myofascial release;Taping    Manual therapy comments  prone & sidelying    Soft tissue mobilization  STM to R piriformis/glutes & mid/lateral quads    Myofascial Release  manual TPR to R glute minimus & mid piriformis, prox RF & distal VL    Kinesiotex  Inhibit Muscle      Kinesiotix   Inhibit Muscle   R piriformis - 30% along muscle belly + 50% perpendicular strip over sciatic nerve       Trigger Point Dry Needling - 03/26/18 0801    Consent Given?  Yes    Muscles Treated Lower Body  Gluteus minimus;Gluteus maximus;Piriformis;Quadriceps    Gluteus  Maximus Response  Twitch response elicited;Palpable increased muscle length   Rt   Gluteus Minimus Response  Twitch response elicited;Palpable increased muscle length   Rt   Piriformis Response  Twitch response elicited;Palpable increased muscle length   Rt   Quadriceps Response  Twitch response elicited;Palpable increased muscle length   Rt               PT Long Term Goals - 03/04/18 0818      PT LONG TERM GOAL #1   Title  Independent with ongoing HEP    Status  On-going      PT LONG TERM GOAL #2   Title  Patient to demonstrate improved proximal LE tissue quality and pliability with reduced pain    Status  On-going      PT LONG TERM GOAL #3   Title  Patient will report ability to drive for >/= 1 hr w/o limitation due to R buttock pain    Status  On-going            Plan - 03/26/18 0805    Clinical Impression Statement  Pt noting more sciatic type pain while driving over 2.5 hrs to/from [facility] yesterday but also noting more muscle soreness from increased intensity running  with his daughter over the weekend. Conitnued increased tension noted in R glutes and piriformis as well as mid/lateral quads today which were addressed with manual therapy and DN with positive twitch response resulting in palpable reduction in muscle tension. Initiated trial of kinesiotaping to R piriormis to promote further muscle relaxation and conlcuded visit with estim and moist heat to both glutes/piriformis and quads as pt noting some increased muscle soreness in quads following DN.    Rehab Potential  Good    PT Treatment/Interventions  Patient/family education;Neuromuscular re-education;Therapeutic exercise;Therapeutic activities;ADLs/Self Care Home Management;Electrical Stimulation;Moist Heat;Ultrasound;Iontophoresis 4mg /ml Dexamethasone;Manual techniques;Dry needling;Taping    Consulted and Agree with Plan of Care  Patient       Patient will benefit from skilled therapeutic intervention in order to improve the following deficits and impairments:  Pain, Increased muscle spasms, Impaired flexibility, Decreased range of motion, Decreased strength, Decreased activity tolerance, Postural dysfunction, Improper body mechanics  Visit Diagnosis: Pain in right hip  Cramp and spasm  Other symptoms and signs involving the musculoskeletal system     Problem List Patient Active Problem List   Diagnosis Date Noted  . Left shoulder pain 07/02/2016  . WRIST PAIN, LEFT 07/13/2010  . BACK PAIN 07/13/2010    Percival Spanish, PT, MPT 03/26/2018, 1:52 PM  [facility] [address]  [place], [place], [address] Phone: [phone]   Fax:  [phone]  Name: Juan Collier MRN: [id number] Date of Birth: 03-Nov-1970

## 2018-04-02 ENCOUNTER — Ambulatory Visit: Payer: BC Managed Care – PPO | Admitting: Physical Therapy

## 2018-04-02 ENCOUNTER — Encounter: Payer: Self-pay | Admitting: Physical Therapy

## 2018-04-02 DIAGNOSIS — R252 Cramp and spasm: Secondary | ICD-10-CM

## 2018-04-02 DIAGNOSIS — M25551 Pain in right hip: Secondary | ICD-10-CM | POA: Diagnosis not present

## 2018-04-02 DIAGNOSIS — R29898 Other symptoms and signs involving the musculoskeletal system: Secondary | ICD-10-CM

## 2018-04-02 NOTE — Therapy (Signed)
[facility] [address]  [place], [place], [address] Phone: [phone]   Fax:  [phone]  Physical Therapy Treatment  Patient Details  Name: FELIZ LINCOLN MRN: [id number] Date of Birth: 09-03-1970 Referring Provider (PT): Karlton Lemon, MD   Encounter Date: 04/02/2018  PT End of Session - 04/02/18 0801    Visit Number  6    Number of Visits  8    Date for PT Re-Evaluation  03/25/18    Authorization Type  BCBS    PT Start Time  0801    PT Stop Time  0843    PT Time Calculation (min)  42 min    Activity Tolerance  Patient tolerated treatment well    Behavior During Therapy  [facility] for tasks assessed/performed       Past Medical History:  Diagnosis Date  . Allergy   . History of fainting spells of unknown cause   . History of stomach ulcers   . Rheumatic fever   . Tuberculosis positve     History reviewed. No pertinent surgical history.  There were no vitals filed for this visit.  Subjective Assessment - 04/02/18 0803    Subjective  Pt noting some soreness/pulling after running a race on Sunday then cutting/splitting wood on Monday - gone by Tuesday.    Patient Stated Goals  "to be able to drive w/o pain"    Currently in Pain?  No/denies                       [facility] Adult PT Treatment/Exercise - 04/02/18 0801      Exercises   Exercises  Knee/Hip      Knee/Hip Exercises: Aerobic   Recumbent Bike  L3 x 6 min      Knee/Hip Exercises: Supine   Bridges with Clamshell  Both;15 reps   + alt hip ABD/ER with green TB     Knee/Hip Exercises: Sidelying   Clams  B clam with green TB in side plank (elbow to knee) x 15      Knee/Hip Exercises: Prone   Other Prone Exercises  R/L fire hydrant with green TB x15      Manual Therapy   Manual Therapy  Soft tissue mobilization;Myofascial release    Manual therapy comments  prone    Soft tissue mobilization  STM to B piriformis/glutes    Myofascial Release  manual TPR to L glute minimus & B medial piriformis       Trigger Point Dry Needling - 04/02/18 0801    Consent Given?  Yes    Muscles Treated Lower Body  Gluteus minimus;Gluteus maximus;Piriformis    Gluteus Maximus Response  Twitch response elicited;Palpable increased muscle length   Lt   Gluteus Minimus Response  Twitch response elicited;Palpable increased muscle length   Lt   Piriformis Response  Twitch response elicited;Palpable increased muscle length   Bil               PT Long Term Goals - 03/04/18 0818      PT LONG TERM GOAL #1   Title  Independent with ongoing HEP    Status  On-going      PT LONG TERM GOAL #2   Title  Patient to demonstrate improved proximal LE tissue quality and pliability with reduced pain    Status  On-going      PT LONG TERM GOAL #3   Title  Patient  will report ability to drive for >/= 1 hr w/o limitation due to R buttock pain    Status  On-going            Plan - 04/02/18 0805    Clinical Impression Statement  Dominick reporting he no longer notices pain while driving, but did have some increased "pulling" after running a race on Sunday and then cutting/splitting wood on Monday which was resolvde by Tuesday. Pt notes more awareness of tightness in L buttock now than R side has settled down - palpation revealing increased muscle tension in L glute minimus and B piriformis today which was addressed with manual therapy and DN with positive twitch response followed by palpable reduction in muscle tension. Provided guidance in progression of strengthening exercises to promote maintainance of normalized muscle tension with good tolerance.    Rehab Potential  Good    PT Treatment/Interventions  Patient/family education;Neuromuscular re-education;Therapeutic exercise;Therapeutic activities;ADLs/Self Care Home Management;Electrical Stimulation;Moist Heat;Ultrasound;Iontophoresis 4mg /ml Dexamethasone;Manual techniques;Dry  needling;Taping    Consulted and Agree with Plan of Care  Patient       Patient will benefit from skilled therapeutic intervention in order to improve the following deficits and impairments:  Pain, Increased muscle spasms, Impaired flexibility, Decreased range of motion, Decreased strength, Decreased activity tolerance, Postural dysfunction, Improper body mechanics  Visit Diagnosis: Pain in right hip  Cramp and spasm  Other symptoms and signs involving the musculoskeletal system     Problem List Patient Active Problem List   Diagnosis Date Noted  . Left shoulder pain 07/02/2016  . WRIST PAIN, LEFT 07/13/2010  . BACK PAIN 07/13/2010    Percival Spanish, PT, MPT 04/02/2018, 8:55 AM  [facility] [address]  [place], [place], [address] Phone: [phone]   Fax:  [phone]  Name: DESIDERIO DOLATA MRN: [id number] Date of Birth: [birth year]

## 2018-04-22 ENCOUNTER — Encounter: Payer: Self-pay | Admitting: Family Medicine

## 2018-04-22 ENCOUNTER — Ambulatory Visit: Payer: BC Managed Care – PPO | Admitting: Family Medicine

## 2018-04-22 ENCOUNTER — Ambulatory Visit: Payer: BC Managed Care – PPO | Attending: Family Medicine | Admitting: Physical Therapy

## 2018-04-22 ENCOUNTER — Encounter: Payer: Self-pay | Admitting: Physical Therapy

## 2018-04-22 VITALS — BP 111/70 | HR 68 | Ht 68.0 in | Wt 170.0 lb

## 2018-04-22 DIAGNOSIS — R29898 Other symptoms and signs involving the musculoskeletal system: Secondary | ICD-10-CM | POA: Insufficient documentation

## 2018-04-22 DIAGNOSIS — R252 Cramp and spasm: Secondary | ICD-10-CM

## 2018-04-22 DIAGNOSIS — M25551 Pain in right hip: Secondary | ICD-10-CM

## 2018-04-22 DIAGNOSIS — M25511 Pain in right shoulder: Secondary | ICD-10-CM | POA: Diagnosis not present

## 2018-04-22 NOTE — Patient Instructions (Signed)
Your shoulder pain is due to mild subluxation vs  Try to avoid painful activities (overhead activities, lifting with extended arm) as much as possible. Aleve 2 tabs twice a day with food OR ibuprofen 3 tabs three times a day with food for pain and inflammation only if needed. Can take tylenol in addition to this. Subacromial injection may be beneficial to help with pain and to decrease inflammation. Do home exercise program with theraband and scapular stabilization exercises daily 3 sets of 10 once a day. If not improving at follow-up we will consider further imaging, injection, physical therapy, and/or nitro patches. Follow up with me in 6 weeks or as needed if you're doing well.  Continue the exercises and stretches for your piriformis until pain resolves.

## 2018-04-22 NOTE — Therapy (Signed)
[facility] [address]  [place], [place], [address] Phone: [phone]   Fax:  [phone]  Physical Therapy Treatment / Discharge Summary  Patient Details  Name: Juan Collier MRN: [id number] Date of Birth: 06-25-1970 Referring Provider (PT): Karlton Lemon, MD   Encounter Date: 04/22/2018  PT End of Session - 04/22/18 0936    Visit Number  7    Number of Visits  8    Authorization Type  BCBS    PT Start Time  [phone]    PT Stop Time  1036    PT Time Calculation (min)  60 min    Activity Tolerance  Patient tolerated treatment well    Behavior During Therapy  [facility] for tasks assessed/performed       Past Medical History:  Diagnosis Date  . Allergy   . History of fainting spells of unknown cause   . History of stomach ulcers   . Rheumatic fever   . Tuberculosis positve     History reviewed. No pertinent surgical history.  There were no vitals filed for this visit.  Subjective Assessment - 04/22/18 0940    Subjective  Pt noting a little pain in the L buttock yesterday which he was able to resolve with stretches & HEP, but otherwise notes significant improvement in overall pain. Has been able to drive more w/o limitation due to buttock or radicular pain and was able to run a race last Thursday w/o limitation or pain followiing this.    How long can you sit comfortably?  no limitation    How long can you stand comfortably?  no limitation    Patient Stated Goals  "to be able to drive w/o pain"    Currently in Pain?  No/denies         [facility] PT Assessment - 04/22/18 0936      Assessment   Medical Diagnosis  R piriformis syndrome    Referring Provider (PT)  Karlton Lemon, MD    Next MD Visit  PRN      Strength   Right Hip Flexion  5/5    Right Hip Extension  5/5    Right Hip External Rotation   4/5    Right Hip Internal Rotation  5/5    Right Hip ABduction  5/5    Right Hip ADduction  5/5    Left Hip  Flexion  5/5    Left Hip Extension  5/5    Left Hip External Rotation  4+/5    Left Hip Internal Rotation  5/5    Left Hip ABduction  5/5    Left Hip ADduction  5/5                   [facility] Adult PT Treatment/Exercise - 04/22/18 0936      Exercises   Exercises  Knee/Hip      Knee/Hip Exercises: Aerobic   Recumbent Bike  L3 x 8 min      Knee/Hip Exercises: Sidelying   Clams  B clam with green TB in side plank (elbow to knee) x 15      Knee/Hip Exercises: Prone   Other Prone Exercises  R/L fire hydrant with green TB x15      Modalities   Modalities  Electrical Stimulation;Moist Heat      Moist Heat Therapy   Number Minutes Moist Heat  15 Minutes    Moist Heat Location  Shoulder  Rt     Acupuncturist Location  R ant/lat shoulder    Electrical Stimulation Action  IFC    Electrical Stimulation Parameters  80-150 Hz, intensity to pt tol x 15'    Electrical Stimulation Goals  Pain;Tone      Manual Therapy   Manual Therapy  Soft tissue mobilization;Myofascial release    Manual therapy comments  supine    Soft tissue mobilization  STM to R ant/lat shoulder complex    Myofascial Release  manual TPR to R ant/lat deltoid & biceps       Trigger Point Dry Needling - 04/22/18 0936    Consent Given?  Yes    Muscles Treated Upper Body  --   R ant & mid deltoid: + twitch response/dec muscle tension               PT Long Term Goals - 04/22/18 0943      PT LONG TERM GOAL #1   Title  Independent with ongoing HEP    Status  Achieved      PT LONG TERM GOAL #2   Title  Patient to demonstrate improved proximal LE tissue quality and pliability with reduced pain    Status  Achieved      PT LONG TERM GOAL #3   Title  Patient will report ability to drive for >/= 1 hr w/o limitation due to R buttock pain    Status  Achieved            Plan - 04/22/18 0943    Clinical Impression Statement  Juan Collier is very pleased with his  progress in regards to the R piriformis syndrome (as well as the L which became more pronounced as the R side was resolving) - pt reporting no further limitations with positional tolerance and states he has been able run w/o issues and drive w/o radicular LE pain. All goals met for this episode and pt ready for discharge with transition to HEP. Juan Collier saw Dr. Barbaraann Barthel this morning for issues with his R shoulder for which the MD provided a HEP, but did send orders for PT to perform DN to R shoulder. Palpable knot present in R ant/mid deltoid with taut bands noted more proximally as well as in biceps which were addressed with manual therapy and DN followed by estim and mosit heat to promote further muscle relaxation. Per MD instructions, pt to work on HEP and f/u with MD in 6 weeks as needed at which time further PT may be considered.     Rehab Potential  Good    PT Treatment/Interventions  Patient/family education;Neuromuscular re-education;Therapeutic exercise;Therapeutic activities;ADLs/Self Care Home Management;Electrical Stimulation;Moist Heat;Ultrasound;Iontophoresis 58m/ml Dexamethasone;Manual techniques;Dry needling;Taping    PT Next Visit Plan  Discharge for R piriformis syndrome episode; New eval for R shoulder as ordered/indicated by Dr. HJosephine Cablesand Agree with Plan of Care  Patient       Patient will benefit from skilled therapeutic intervention in order to improve the following deficits and impairments:  Pain, Increased muscle spasms, Impaired flexibility, Decreased range of motion, Decreased strength, Decreased activity tolerance, Postural dysfunction, Improper body mechanics  Visit Diagnosis: Pain in right hip  Cramp and spasm  Other symptoms and signs involving the musculoskeletal system     Problem List Patient Active Problem List   Diagnosis Date Noted  . Left shoulder pain 07/02/2016  . WRIST PAIN, LEFT 07/13/2010  . BACK PAIN 07/13/2010   PHYSICAL  THERAPY  DISCHARGE SUMMARY  Visits from Start of Care: 7  Current functional level related to goals / functional outcomes:   Refer to above clinical impression.   Remaining deficits:   As above.   Education / Equipment:   HEP  Plan: Patient agrees to discharge.  Patient goals were met. Patient is being discharged due to meeting the stated rehab goals.  ?????      Percival Spanish, PT, MPT 04/22/2018, 12:25 PM  [facility] [address]  [place], [place], [address] Phone: [phone]   Fax:  [phone]  Name: Juan Collier MRN: [id number] Date of Birth: [birth year]

## 2018-04-22 NOTE — Progress Notes (Signed)
PCP: Copland, Gay Filler, MD Consultation requested by Mackie Pai PA-C  Subjective:   HPI: Patient is a 47 y.o. male here for posterior hip pain/back pain.  10/8: Patient reports he's had several years of off and on low back pain that intermittently bothered him with driving. Past 3 months pain has become more constant. Still worse primarily with driving but feels pain mostly in right buttock/low back pain into thigh and calf. Pain level 0/10 at rest but up to 8-9/10 and sharp with driving. Pain is a squeezing, pressing as well going into his right leg posteriorly through calf. Bothers pushing on the pedal. No problems running. Physical therapy helped previously about 8-9 years ago. No skin changes, numbness. No bowel/bladder dysfunction.  12/3: Patient reports his piriformis syndrome is much better. Doing well with exercises and stretches. Pain now 0/10. Gets some soreness both sides but able to run. No numbness/tingling. Driving ok. Couple weeks ago developed lateral right shoulder pain just when lifting out to the side. Pain was sharp, still feels at time. Couldn't lift his right arm for a couple days. Couldn't sleep on right side. Bicep muscle has been sensitive. No skin changes, numbness.  Past Medical History:  Diagnosis Date  . Allergy   . History of fainting spells of unknown cause   . History of stomach ulcers   . Rheumatic fever   . Tuberculosis positve     No current outpatient medications on file prior to visit.   No current facility-administered medications on file prior to visit.     History reviewed. No pertinent surgical history.  Allergies  Allergen Reactions  . Mold Extract [Trichophyton]   . [facility] [Quercus Robur]   . Pollen Extract     Social History   Socioeconomic History  . Marital status: Married    Spouse name: Not on file  . Number of children: Not on file  . Years of education: Not on file  . Highest education level: Not  on file  Occupational History  . Not on file  Social Needs  . Financial resource strain: Not on file  . Food insecurity:    Worry: Not on file    Inability: Not on file  . Transportation needs:    Medical: Not on file    Non-medical: Not on file  Tobacco Use  . Smoking status: Never Smoker  . Smokeless tobacco: Never Used  Substance and Sexual Activity  . Alcohol use: Yes    Alcohol/week: 2.0 standard drinks    Types: 2 Cans of beer per week  . Drug use: No  . Sexual activity: Yes    Birth control/protection: None  Lifestyle  . Physical activity:    Days per week: Not on file    Minutes per session: Not on file  . Stress: Not on file  Relationships  . Social connections:    Talks on phone: Not on file    Gets together: Not on file    Attends religious service: Not on file    Active member of club or organization: Not on file    Attends meetings of clubs or organizations: Not on file    Relationship status: Not on file  . Intimate partner violence:    Fear of current or ex partner: Not on file    Emotionally abused: Not on file    Physically abused: Not on file    Forced sexual activity: Not on file  Other Topics Concern  . Not  on file  Social History Narrative  . Not on file    Family History  Problem Relation Age of Onset  . Thyroid disease Daughter   . ADD / ADHD Son   . Bipolar disorder Maternal Grandmother     BP 111/70   Pulse 68   Ht 5\' 8"  (1.727 m)   Wt 170 lb (77.1 kg)   BMI 25.85 kg/m   Review of Systems: See HPI above.     Objective:  Physical Exam:  Gen: NAD, comfortable in exam room  Bilateral hips: No deformity. FROM with 5/5 strength. No tenderness to palpation. NVI distally. Negative logroll. Negative fabers and piriformis stretches.  Right shoulder: No swelling, ecchymoses.  No gross deformity. No TTP. FROM without pain. Negative Hawkins, Neers. Negative Yergasons. Strength 5/5 with empty can and resisted  internal/external rotation. Negative apprehension. Negative sulcus. Negative o'briens. NV intact distally.  Left shoulder: No swelling, ecchymoses.  No gross deformity. No TTP. FROM. Strength 5/5 with empty can and resisted internal/external rotation. NV intact distally.   Assessment & Plan:  1. Right hip pain - 2/2 piriformis syndrome.  Much improved with exercises and stretches - continue these until pain resolves.  2. Right shoulder pain - 2/2 mild subluxation vs impingement.  Exam reassuring now.  Start home exercise program which was reviewed today.  Aleve or ibuprofen if needed.  Consider physical therapy, injection, imaging, nitro patches if not improving.  F/u in 6 weeks or prn.

## 2018-07-09 ENCOUNTER — Encounter: Payer: Self-pay | Admitting: Family Medicine

## 2018-07-09 ENCOUNTER — Telehealth: Payer: Self-pay | Admitting: Family Medicine

## 2018-07-09 MED ORDER — OSELTAMIVIR PHOSPHATE 75 MG PO CAPS: 75.0000 mg | ORAL_CAPSULE | Freq: Two times a day (BID) | ORAL | 0 refills | Status: DC

## 2018-07-09 NOTE — Telephone Encounter (Signed)
Please advise 

## 2018-07-09 NOTE — Telephone Encounter (Signed)
Copied from [place] [phone]. Topic: General - Other >> Jul 09, 2018 12:58 PM Keene Breath wrote: Reason for CRM: Patient called to inform the doctor that both his children have the flu and he is starting to feel sick as well.  Patient would like [place] flu called in.  Please advise and call patient back at [phone]

## 2018-07-16 ENCOUNTER — Ambulatory Visit (HOSPITAL_COMMUNITY)
Admission: EM | Admit: 2018-07-16 | Discharge: 2018-07-16 | Disposition: A | Discharge status: Home / Self Care | Payer: BC Managed Care – PPO | Attending: Family Medicine | Admitting: Family Medicine

## 2018-07-16 ENCOUNTER — Encounter (HOSPITAL_COMMUNITY): Payer: Self-pay | Admitting: Emergency Medicine

## 2018-07-16 DIAGNOSIS — N451 Epididymitis: Secondary | ICD-10-CM

## 2018-07-16 LAB — POCT URINALYSIS DIP (DEVICE)
Bilirubin Urine: NEGATIVE
Glucose, UA: NEGATIVE mg/dL
HGB URINE DIPSTICK: NEGATIVE
Ketones, ur: NEGATIVE mg/dL
Leukocytes,Ua: NEGATIVE
Nitrite: NEGATIVE
PH: 6 (ref 5.0–8.0)
PROTEIN: NEGATIVE mg/dL
SPECIFIC GRAVITY, URINE: 1.02 (ref 1.005–1.030)
UROBILINOGEN UA: 0.2 mg/dL (ref 0.0–1.0)

## 2018-07-16 MED ORDER — MELOXICAM 15 MG PO TABS: 15.0000 mg | ORAL_TABLET | Freq: Every day | ORAL | 1 refills | Status: DC

## 2018-07-16 MED ORDER — CIPROFLOXACIN HCL 500 MG PO TABS: 500.0000 mg | ORAL_TABLET | Freq: Two times a day (BID) | ORAL | 0 refills | Status: DC

## 2018-07-16 NOTE — ED Triage Notes (Signed)
Pt sts RLQ pain and pain in right testicle worse with positioning

## 2018-07-16 NOTE — ED Provider Notes (Signed)
[facility]    CSN: [id number] Arrival date & time: 07/16/18  1831     History   Chief Complaint Chief Complaint  Patient presents with  . Abdominal Pain  . Testicle Pain    HPI Juan Collier is a 48 y.o. male.   Last week patient had onset of right testicular pain.  Denies any urinary symptoms.  No known sexual exposures.  About 4 days ago he developed some right lower quadrant pain.  Has had no fever normal appetite no nausea vomiting diarrhea or constipation  HPI  Past Medical History:  Diagnosis Date  . Allergy   . History of fainting spells of unknown cause   . History of stomach ulcers   . Rheumatic fever   . Tuberculosis positve     Patient Active Problem List   Diagnosis Date Noted  . Left shoulder pain 07/02/2016  . WRIST PAIN, LEFT 07/13/2010  . BACK PAIN 07/13/2010    History reviewed. No pertinent surgical history.     Home Medications    Prior to Admission medications   Medication Sig Start Date End Date Taking? Authorizing Provider  ciprofloxacin (CIPRO) 500 MG tablet Take 1 tablet (500 mg total) by mouth every 12 (twelve) hours. 07/16/18   Wardell Honour, MD  meloxicam (MOBIC) 15 MG tablet Take 1 tablet (15 mg total) by mouth daily. 07/16/18   Wardell Honour, MD  oseltamivir (TAMIFLU) 75 MG capsule Take 1 capsule (75 mg total) by mouth 2 (two) times daily. Patient not taking: Reported on 07/16/2018 07/09/18   Copland, Gay Filler, MD    Family History Family History  Problem Relation Age of Onset  . Thyroid disease Daughter   . ADD / ADHD Son   . Bipolar disorder Maternal Grandmother     Social History Social History   Tobacco Use  . Smoking status: Never Smoker  . Smokeless tobacco: Never Used  Substance Use Topics  . Alcohol use: Yes    Alcohol/week: 2.0 standard drinks    Types: 2 Cans of beer per week  . Drug use: No     Allergies   Mold extract [trichophyton]; Oak bark [quercus robur]; and Pollen  extract   Review of Systems Review of Systems  Gastrointestinal: Positive for abdominal pain.  Genitourinary: Positive for scrotal swelling.  All other systems reviewed and are negative.    Physical Exam Triage Vital Signs ED Triage Vitals [07/16/18 1931]  Enc Vitals Group     BP 108/63     Pulse Rate 67     Resp 18     Temp 98.1 F (36.7 C)     Temp Source Oral     SpO2 99 %     Weight      Height      Head Circumference      Peak Flow      Pain Score 8     Pain Loc      Pain Edu?      Excl. in [place]?    No data found.  Updated Vital Signs BP 108/63 (BP Location: Right Arm)   Pulse 67   Temp 98.1 F (36.7 C) (Oral)   Resp 18   SpO2 99%   Visual Acuity Right Eye Distance:   Left Eye Distance:   Bilateral Distance:    Right Eye Near:   Left Eye Near:    Bilateral Near:     Physical Exam Constitutional:  Appearance: He is well-developed.  HENT:     Head: Normocephalic.     Mouth/Throat:     Mouth: Mucous membranes are moist.  Cardiovascular:     Rate and Rhythm: Normal rate and regular rhythm.  Pulmonary:     Effort: Pulmonary effort is normal.     Breath sounds: Normal breath sounds.  Abdominal:     General: Bowel sounds are normal.     Palpations: Abdomen is soft.     Tenderness: There is no abdominal tenderness. There is no guarding or rebound.  Genitourinary:    Comments: There is tenderness on posterior aspect of the right testicle consistent with epididymitis Neurological:     Mental Status: He is alert.      UC Treatments / Results  Labs (all labs ordered are listed, but only abnormal results are displayed) Labs Reviewed  POCT URINALYSIS DIP (DEVICE)    EKG None  Radiology No results found.  Procedures Procedures (including critical care time)  Medications Ordered in UC Medications - No data to display  Initial Impression / Assessment and Plan / UC Course  I have reviewed the triage vital signs and the nursing  notes.  Pertinent labs & imaging results that were available during my care of the patient were reviewed by me and considered in my medical decision making (see chart for details).     Epididymitis.  Urinalysis is negative.  Doubt STD etiology Final Clinical Impressions(s) / UC Diagnoses   Final diagnoses:  Epididymitis, right   Discharge Instructions   None    ED Prescriptions    Medication Sig Dispense Auth. Provider   ciprofloxacin (CIPRO) 500 MG tablet Take 1 tablet (500 mg total) by mouth every 12 (twelve) hours. 10 tablet Wardell Honour, MD   meloxicam (MOBIC) 15 MG tablet Take 1 tablet (15 mg total) by mouth daily. 12 tablet Wardell Honour, MD     Controlled Substance Prescriptions [place] Controlled Substance Registry consulted? No   Wardell Honour, MD 07/16/18 2005

## 2019-03-12 ENCOUNTER — Other Ambulatory Visit: Payer: Self-pay

## 2019-03-12 ENCOUNTER — Ambulatory Visit (INDEPENDENT_AMBULATORY_CARE_PROVIDER_SITE_OTHER): Payer: BC Managed Care – PPO | Admitting: Medical

## 2019-03-12 VITALS — BP 112/65 | HR 56 | Temp 96.8°F | Resp 16 | Ht 68.0 in | Wt 160.8 lb

## 2019-03-12 DIAGNOSIS — Z125 Encounter for screening for malignant neoplasm of prostate: Secondary | ICD-10-CM | POA: Diagnosis not present

## 2019-03-12 DIAGNOSIS — Z0000 Encounter for general adult medical examination without abnormal findings: Secondary | ICD-10-CM

## 2019-03-12 DIAGNOSIS — N451 Epididymitis: Secondary | ICD-10-CM | POA: Diagnosis not present

## 2019-03-12 DIAGNOSIS — Z1211 Encounter for screening for malignant neoplasm of colon: Secondary | ICD-10-CM | POA: Diagnosis not present

## 2019-03-12 DIAGNOSIS — R5383 Other fatigue: Secondary | ICD-10-CM | POA: Diagnosis not present

## 2019-03-12 DIAGNOSIS — Z23 Encounter for immunization: Secondary | ICD-10-CM

## 2019-03-12 DIAGNOSIS — N5089 Other specified disorders of the male genital organs: Secondary | ICD-10-CM

## 2019-03-12 DIAGNOSIS — R42 Dizziness and giddiness: Secondary | ICD-10-CM

## 2019-03-12 LAB — TSH: TSH: 1.64 u[IU]/mL (ref 0.35–4.50)

## 2019-03-12 LAB — COMPREHENSIVE METABOLIC PANEL
ALT: 15 U/L (ref 0–53)
AST: 17 U/L (ref 0–37)
Albumin: 4.5 g/dL (ref 3.5–5.2)
Alkaline Phosphatase: 49 U/L (ref 39–117)
BUN: 16 mg/dL (ref 6–23)
CO2: 29 mEq/L (ref 19–32)
Calcium: 9.6 mg/dL (ref 8.4–10.5)
Chloride: 104 mEq/L (ref 96–112)
Creatinine, Ser: 0.84 mg/dL (ref 0.40–1.50)
GFR: 96.66 mL/min (ref >60.00)
Glucose, Bld: 99 mg/dL (ref 70–99)
Potassium: 4.4 mEq/L (ref 3.5–5.1)
Sodium: 140 mEq/L (ref 135–145)
Total Bilirubin: 0.3 mg/dL (ref 0.2–1.2)
Total Protein: 7.2 g/dL (ref 6.0–8.3)

## 2019-03-12 LAB — CBC WITH DIFFERENTIAL/PLATELET
Basophils Absolute: 0 10*3/uL (ref 0.0–0.1)
Basophils Relative: 0.5 % (ref 0.0–3.0)
Eosinophils Absolute: 0.1 10*3/uL (ref 0.0–0.7)
Eosinophils Relative: 2.2 % (ref 0.0–5.0)
HCT: 45.5 % (ref 39.0–52.0)
Hemoglobin: 15 g/dL (ref 13.0–17.0)
Lymphocytes Relative: 28.3 % (ref 12.0–46.0)
Lymphs Abs: 1.4 10*3/uL (ref 0.7–4.0)
MCHC: 33 g/dL (ref 30.0–36.0)
MCV: 93.8 fl (ref 78.0–100.0)
Monocytes Absolute: 0.3 10*3/uL (ref 0.1–1.0)
Monocytes Relative: 5.4 % (ref 3.0–12.0)
Neutro Abs: 3.2 10*3/uL (ref 1.4–7.7)
Neutrophils Relative %: 63.6 % (ref 43.0–77.0)
Platelets: 257 10*3/uL (ref 150.0–400.0)
RBC: 4.85 Mil/uL (ref 4.22–5.81)
RDW: 13.4 % (ref 11.5–15.5)
WBC: 5 10*3/uL (ref 4.0–10.5)

## 2019-03-12 LAB — LIPID PANEL
Cholesterol: 192 mg/dL (ref 0–200)
HDL: 50.3 mg/dL (ref >39.00)
LDL Cholesterol: 118 mg/dL — ABNORMAL HIGH (ref 0–99)
NonHDL: 142
Total CHOL/HDL Ratio: 4
Triglycerides: 119 mg/dL (ref 0.0–149.0)
VLDL: 23.8 mg/dL (ref 0.0–40.0)

## 2019-03-12 LAB — PSA: PSA: 1.36 ng/mL (ref 0.10–4.00)

## 2019-03-12 LAB — VITAMIN B12: Vitamin B-12: 340 pg/mL (ref 211–911)

## 2019-03-12 NOTE — Progress Notes (Signed)
Subjective:    Patient ID: Juan Collier, male    DOB: 04-04-1971, 48 y.o.   MRN: [id number]  HPI  Pt in wellness exam.  Pt is fasting. He wants flu vaccine.  Pt is exercising/jogging regularly. Jogging 3-4 days a week. 18-20 miles a week.  Pt states his diet is good. Pt has been purposefully loosing weight in attempt to improve speed jogging.     Review of Systems  Constitutional: Positive for fatigue. Negative for chills and fever.       He also reports feeling fatigue recenlty.   Eyes:       Transient blurred vision when stands up quickly.  Respiratory: Negative for cough, chest tightness, shortness of breath and wheezing.   Cardiovascular: Negative for chest pain and palpitations.  Gastrointestinal: Negative for abdominal pain.  Genitourinary: Negative for decreased urine volume, frequency, penile swelling and testicular pain.       History of epididymitis on rt side. He went to urgent care. Treated for epididymitis. He thinks testicle feels abnormal now.  Musculoskeletal: Negative for back pain, myalgias and neck pain.  Skin: Negative for rash.  Neurological: Positive for dizziness.       Transient intermittent dizziness most often when stands up quickly and ambulates. When that happens may have slight transient blurred vision. He states has occurred in past before.   Pt does have lower end baseline bp.  Hematological: Negative for adenopathy. Does not bruise/bleed easily.  Psychiatric/Behavioral: Negative for behavioral problems and confusion.     Past Medical History:  Diagnosis Date  . Allergy   . History of fainting spells of unknown cause   . History of stomach ulcers   . Rheumatic fever   . Tuberculosis positve      Social History   Socioeconomic History  . Marital status: Married    Spouse name: Not on file  . Number of children: Not on file  . Years of education: Not on file  . Highest education level: Not on file  Occupational History  . Not on  file  Social Needs  . Financial resource strain: Not on file  . Food insecurity    Worry: Not on file    Inability: Not on file  . Transportation needs    Medical: Not on file    Non-medical: Not on file  Tobacco Use  . Smoking status: Never Smoker  . Smokeless tobacco: Never Used  Substance and Sexual Activity  . Alcohol use: Yes    Alcohol/week: 2.0 standard drinks    Types: 2 Cans of beer per week  . Drug use: No  . Sexual activity: Yes    Birth control/protection: None  Lifestyle  . Physical activity    Days per week: Not on file    Minutes per session: Not on file  . Stress: Not on file  Relationships  . Social Herbalist on phone: Not on file    Gets together: Not on file    Attends religious service: Not on file    Active member of club or organization: Not on file    Attends meetings of clubs or organizations: Not on file    Relationship status: Not on file  . Intimate partner violence    Fear of current or ex partner: Not on file    Emotionally abused: Not on file    Physically abused: Not on file    Forced sexual activity: Not on file  Other  Topics Concern  . Not on file  Social History Narrative  . Not on file    No past surgical history on file.  Family History  Problem Relation Age of Onset  . Thyroid disease Daughter   . ADD / ADHD Son   . Bipolar disorder Maternal Grandmother     Allergies  Allergen Reactions  . Mold Extract [Trichophyton]   . [facility] [Quercus Robur]   . Pollen Extract     No current outpatient medications on file prior to visit.   No current facility-administered medications on file prior to visit.     BP 112/65   Pulse (!) 56   Temp (!) 96.8 F (36 C) (Temporal)   Resp 16   Ht 5\' 8"  (1.727 m)   Wt 160 lb 12.8 oz (72.9 kg)   SpO2 100%   BMI 24.45 kg/m       Objective:   Physical Exam   General Mental Status- Alert. General Appearance- Not in acute distress.   Skin General: Color- Normal  Color. Moisture- Normal Moisture. Scattered small freckles. Small moles(no worrisome features)  Neck Carotid Arteries- Normal color. Moisture- Normal Moisture. No carotid bruits. No JVD.  Chest and Lung Exam Auscultation: Breath Sounds:-Normal.  Cardiovascular Auscultation:Rythm- Regular. Murmurs & Other Heart Sounds:Auscultation of the heart reveals- No Murmurs.  Abdomen Inspection:-Inspeection Normal. Palpation/Percussion:Note:No mass. Palpation and Percussion of the abdomen reveal- Non Tender, Non Distended + BS, no rebound or guarding.   Neurologic Cranial Nerve exam:- CN III-XII intact(No nystagmus), symmetric smile. Strength:- 5/5 equal and symmetric strength both upper and lower extremities.  Genital- normal. But scrotum has circular mass of lump connect to rt testicle.      Assessment & Plan:  For you wellness exam today I have ordered cbc, cmp, lipid panel and psa.  For fatigue will get b12, b1 and tsh.  Flu vaccine given today.  Placed referral to colonoscopy.  Recommend exercise and healthy diet.  We will let you know lab results as they come in.  Follow up date appointment will be determined after lab review.   Counseled on potential postural hypotension. Caution on how to avoid. I don't think further weight loss good idea. Hydrate well.  For abnormal scrotum/testicle urology referral but get [place] today. See if need to get bumped up/sooner appointment.  Follow up date to be determined after lab review.  [id number] charged. 15 + minutes addressing fatigue, dizziness, and testicle/scrotum abnormality.  Mackie Pai, PA-C

## 2019-03-12 NOTE — Patient Instructions (Addendum)
For you wellness exam today I have ordered cbc, cmp, lipid panel and psa.  For fatigue will get b12, b1 and tsh.  Flu vaccine given today.  Placed referral to colonoscopy.  Recommend exercise and healthy diet.  We will let you know lab results as they come in.  Follow up date appointment will be determined after lab review.   Counseled on potential postural hypotension. Caution on how to avoid. I don't think further weight loss good idea. Hydrate well.  For abnormal scrotum/testicle urology referral but get [place] today. See if need to get bumped up/sooner appointment.  Follow up date to be determined after lab review.   Preventive Care 24-46 Years Old, Male Preventive care refers to lifestyle choices and visits with your health care provider that can promote health and wellness. This includes:  A yearly physical exam. This is also called an annual well check.  Regular dental and eye exams.  Immunizations.  Screening for certain conditions.  Healthy lifestyle choices, such as eating a healthy diet, getting regular exercise, not using drugs or products that contain nicotine and tobacco, and limiting alcohol use. What can I expect for my preventive care visit? Physical exam Your health care provider will check:  Height and weight. These may be used to calculate body mass index (BMI), which is a measurement that tells if you are at a healthy weight.  Heart rate and blood pressure.  Your skin for abnormal spots. Counseling Your health care provider may ask you questions about:  Alcohol, tobacco, and drug use.  Emotional well-being.  Home and relationship well-being.  Sexual activity.  Eating habits.  Work and work Statistician. What immunizations do I need?  Influenza (flu) vaccine  This is recommended every year. Tetanus, diphtheria, and pertussis (Tdap) vaccine  You may need a Td booster every 10 years. Varicella (chickenpox) vaccine  You may need this  vaccine if you have not already been vaccinated. Zoster (shingles) vaccine  You may need this after age 70. Measles, mumps, and rubella (MMR) vaccine  You may need at least one dose of MMR if you were born in 1957 or later. You may also need a second dose. Pneumococcal conjugate (PCV13) vaccine  You may need this if you have certain conditions and were not previously vaccinated. Pneumococcal polysaccharide (PPSV23) vaccine  You may need one or two doses if you smoke cigarettes or if you have certain conditions. Meningococcal conjugate (MenACWY) vaccine  You may need this if you have certain conditions. Hepatitis A vaccine  You may need this if you have certain conditions or if you travel or work in places where you may be exposed to hepatitis A. Hepatitis B vaccine  You may need this if you have certain conditions or if you travel or work in places where you may be exposed to hepatitis B. Haemophilus influenzae type b (Hib) vaccine  You may need this if you have certain risk factors. Human papillomavirus (HPV) vaccine  If recommended by your health care provider, you may need three doses over 6 months. You may receive vaccines as individual doses or as more than one vaccine together in one shot (combination vaccines). Talk with your health care provider about the risks and benefits of combination vaccines. What tests do I need? Blood tests  Lipid and cholesterol levels. These may be checked every 5 years, or more frequently if you are over 60 years old.  Hepatitis C test.  Hepatitis B test. Screening  Lung cancer screening.  You may have this screening every year starting at age 56 if you have a 30-pack-year history of smoking and currently smoke or have quit within the past 15 years.  Prostate cancer screening. Recommendations will vary depending on your family history and other risks.  Colorectal cancer screening. All adults should have this screening starting at age 65  and continuing until age 19. Your health care provider may recommend screening at age 41 if you are at increased risk. You will have tests every 1-10 years, depending on your results and the type of screening test.  Diabetes screening. This is done by checking your blood sugar (glucose) after you have not eaten for a while (fasting). You may have this done every 1-3 years.  Sexually transmitted disease (STD) testing. Follow these instructions at home: Eating and drinking  Eat a diet that includes fresh fruits and vegetables, whole grains, lean protein, and low-fat dairy products.  Take vitamin and mineral supplements as recommended by your health care provider.  Do not drink alcohol if your health care provider tells you not to drink.  If you drink alcohol: ? Limit how much you have to 0-2 drinks a day. ? Be aware of how much alcohol is in your drink. In the [place] one drink equals one 12 oz bottle of beer (355 mL), one 5 oz glass of wine (148 mL), or one 1 oz glass of hard liquor (44 mL). Lifestyle  Take daily care of your teeth and gums.  Stay active. Exercise for at least 30 minutes on 5 or more days each week.  Do not use any products that contain nicotine or tobacco, such as cigarettes, e-cigarettes, and chewing tobacco. If you need help quitting, ask your health care provider.  If you are sexually active, practice safe sex. Use a condom or other form of protection to prevent STIs (sexually transmitted infections).  Talk with your health care provider about taking a low-dose aspirin every day starting at age 14. What's next?  Go to your health care provider once a year for a well check visit.  Ask your health care provider how often you should have your eyes and teeth checked.  Stay up to date on all vaccines. This information is not intended to replace advice given to you by your health care provider. Make sure you discuss any questions you have with your health care  provider. Document Released: 06/03/2015 Document Revised: 05/01/2018 Document Reviewed: 05/01/2018 Elsevier Patient Education  2020 [employer].

## 2019-03-13 ENCOUNTER — Encounter: Payer: Self-pay | Admitting: Medical

## 2019-03-16 ENCOUNTER — Telehealth: Payer: Self-pay | Admitting: Medical

## 2019-03-16 ENCOUNTER — Ambulatory Visit (HOSPITAL_BASED_OUTPATIENT_CLINIC_OR_DEPARTMENT_OTHER)
Admission: RE | Admit: 2019-03-16 | Discharge: 2019-03-16 | Disposition: A | Discharge status: Home / Self Care | Payer: BC Managed Care – PPO | Source: Ambulatory Visit | Attending: Medical | Admitting: Medical

## 2019-03-16 ENCOUNTER — Other Ambulatory Visit (HOSPITAL_BASED_OUTPATIENT_CLINIC_OR_DEPARTMENT_OTHER): Payer: Self-pay | Admitting: Interventional Pain Medicine

## 2019-03-16 ENCOUNTER — Other Ambulatory Visit: Payer: Self-pay

## 2019-03-16 DIAGNOSIS — N451 Epididymitis: Secondary | ICD-10-CM | POA: Diagnosis not present

## 2019-03-16 DIAGNOSIS — N5089 Other specified disorders of the male genital organs: Secondary | ICD-10-CM

## 2019-03-16 LAB — VITAMIN B1: Vitamin B1 (Thiamine): 7 nmol/L — ABNORMAL LOW (ref 8–30)

## 2019-03-16 NOTE — Telephone Encounter (Signed)
Scrotal US placed.

## 2019-04-14 ENCOUNTER — Encounter: Payer: Self-pay | Admitting: Medical

## 2019-05-26 ENCOUNTER — Encounter: Payer: Self-pay | Admitting: Medical

## 2020-04-05 ENCOUNTER — Ambulatory Visit: Payer: BC Managed Care – PPO | Attending: Internal Medicine

## 2020-04-05 DIAGNOSIS — Z23 Encounter for immunization: Secondary | ICD-10-CM

## 2020-04-05 NOTE — Progress Notes (Signed)
   Covid-19 Vaccination Clinic  Name:  Juan Collier    MRN: [id number] DOB: 1970-07-30  04/05/2020  Mr. [name] was observed post Covid-19 immunization for 15 minutes without incident. He was provided with Vaccine Information Sheet and instruction to access the V-Safe system.   Mr. [name] was instructed to call 911 with any severe reactions post vaccine: Marland Kitchen Difficulty breathing  . Swelling of face and throat  . A fast heartbeat  . A bad rash all over body  . Dizziness and weakness   Immunizations Administered    Name Date Dose VIS Date Route   Pfizer COVID-19 Vaccine 04/05/2020  3:02 PM 0.3 mL 03/09/2020 Intramuscular   Manufacturer: Luis Lopez   Lot: [id number]   [place]: [address]-3

## 2020-06-14 NOTE — Progress Notes (Deleted)
[facility] [address], [place], [place] [address] [phone]  Date:  06/15/2020   Name:  Juan Collier   DOB:  1970-11-14   MRN:  [id number]  PCP:  Darreld Mclean, MD    Chief Complaint: No chief complaint on file.   History of Present Illness:  Juan Collier is a 50 y.o. very pleasant male patient who presents with the following:  Pt here today for a CPE Last seen by myself in 2017  Hep C screening Tetanus Colon cancer screening Flu vaccine covid  Labs are due     Patient Active Problem List   Diagnosis Date Noted  . Left shoulder pain 07/02/2016  . WRIST PAIN, LEFT 07/13/2010  . BACK PAIN 07/13/2010    Past Medical History:  Diagnosis Date  . Allergy   . History of fainting spells of unknown cause   . History of stomach ulcers   . Rheumatic fever   . Tuberculosis positve     No past surgical history on file.  Social History   Tobacco Use  . Smoking status: Never Smoker  . Smokeless tobacco: Never Used  Substance Use Topics  . Alcohol use: Yes    Alcohol/week: 2.0 standard drinks    Types: 2 Cans of beer per week  . Drug use: No    Family History  Problem Relation Age of Onset  . Thyroid disease Daughter   . ADD / ADHD Son   . Bipolar disorder Maternal Grandmother     Allergies  Allergen Reactions  . Mold Extract [Trichophyton]   . [facility] [Quercus Robur]   . Pollen Extract     Medication list has been reviewed and updated.  No current outpatient medications on file prior to visit.   No current facility-administered medications on file prior to visit.    Review of Systems:  As per HPI- otherwise negative.   Physical Examination: There were no vitals filed for this visit. There were no vitals filed for this visit. There is no height or weight on file to calculate BMI. Ideal Body Weight:    GEN: no acute distress. HEENT: Atraumatic, Normocephalic.  Ears  and Nose: No external deformity. CV: RRR, No M/G/R. No JVD. No thrill. No extra heart sounds. PULM: CTA B, no wheezes, crackles, rhonchi. No retractions. No resp. distress. No accessory muscle use. ABD: S, NT, ND, +BS. No rebound. No HSM. EXTR: No c/c/e PSYCH: Normally interactive. Conversant.    Assessment and Plan: *** This visit occurred during the SARS-CoV-2 public health emergency.  Safety protocols were in place, including screening questions prior to the visit, additional usage of staff PPE, and extensive cleaning of exam room while observing appropriate contact time as indicated for disinfecting solutions.    Signed Lamar Blinks, MD

## 2020-06-14 NOTE — Patient Instructions (Incomplete)
Good to see you again today!    Health Maintenance, Male Adopting a healthy lifestyle and getting preventive care are important in promoting health and wellness. Ask your health care provider about:  The right schedule for you to have regular tests and exams.  Things you can do on your own to prevent diseases and keep yourself healthy. What should I know about diet, weight, and exercise? Eat a healthy diet  Eat a diet that includes plenty of vegetables, fruits, low-fat dairy products, and lean protein.  Do not eat a lot of foods that are high in solid fats, added sugars, or sodium.   Maintain a healthy weight Body mass index (BMI) is a measurement that can be used to identify possible weight problems. It estimates body fat based on height and weight. Your health care provider can help determine your BMI and help you achieve or maintain a healthy weight. Get regular exercise Get regular exercise. This is one of the most important things you can do for your health. Most adults should:  Exercise for at least 150 minutes each week. The exercise should increase your heart rate and make you sweat (moderate-intensity exercise).  Do strengthening exercises at least twice a week. This is in addition to the moderate-intensity exercise.  Spend less time sitting. Even light physical activity can be beneficial. Watch cholesterol and blood lipids Have your blood tested for lipids and cholesterol at 52 years of age, then have this test every 5 years. You may need to have your cholesterol levels checked more often if:  Your lipid or cholesterol levels are high.  You are older than 52 years of age.  You are at high risk for heart disease. What should I know about cancer screening? Many types of cancers can be detected early and may often be prevented. Depending on your health history and family history, you may need to have cancer screening at various ages. This may include screening  for:  Colorectal cancer.  Prostate cancer.  Skin cancer.  Lung cancer. What should I know about heart disease, diabetes, and high blood pressure? Blood pressure and heart disease  High blood pressure causes heart disease and increases the risk of stroke. This is more likely to develop in people who have high blood pressure readings, are of African descent, or are overweight.  Talk with your health care provider about your target blood pressure readings.  Have your blood pressure checked: ? Every 3-5 years if you are 54-21 years of age. ? Every year if you are 29 years old or older.  If you are between the ages of 43 and 24 and are a current or former smoker, ask your health care provider if you should have a one-time screening for abdominal aortic aneurysm (AAA). Diabetes Have regular diabetes screenings. This checks your fasting blood sugar level. Have the screening done:  Once every three years after age 81 if you are at a normal weight and have a low risk for diabetes.  More often and at a younger age if you are overweight or have a high risk for diabetes. What should I know about preventing infection? Hepatitis B If you have a higher risk for hepatitis B, you should be screened for this virus. Talk with your health care provider to find out if you are at risk for hepatitis B infection. Hepatitis C Blood testing is recommended for:  Everyone born from [birth year] through 1967.  Anyone with known risk factors for hepatitis C. Sexually  transmitted infections (STIs)  You should be screened each year for STIs, including gonorrhea and chlamydia, if: ? You are sexually active and are younger than 52 years of age. ? You are older than 52 years of age and your health care provider tells you that you are at risk for this type of infection. ? Your sexual activity has changed since you were last screened, and you are at increased risk for chlamydia or gonorrhea. Ask your health care  provider if you are at risk.  Ask your health care provider about whether you are at high risk for HIV. Your health care provider may recommend a prescription medicine to help prevent HIV infection. If you choose to take medicine to prevent HIV, you should first get tested for HIV. You should then be tested every 3 months for as long as you are taking the medicine. Follow these instructions at home: Lifestyle  Do not use any products that contain nicotine or tobacco, such as cigarettes, e-cigarettes, and chewing tobacco. If you need help quitting, ask your health care provider.  Do not use street drugs.  Do not share needles.  Ask your health care provider for help if you need support or information about quitting drugs. Alcohol use  Do not drink alcohol if your health care provider tells you not to drink.  If you drink alcohol: ? Limit how much you have to 0-2 drinks a day. ? Be aware of how much alcohol is in your drink. In the [place] one drink equals one 12 oz bottle of beer (355 mL), one 5 oz glass of wine (148 mL), or one 1 oz glass of hard liquor (44 mL). General instructions  Schedule regular health, dental, and eye exams.  Stay current with your vaccines.  Tell your health care provider if: ? You often feel depressed. ? You have ever been abused or do not feel safe at home. Summary  Adopting a healthy lifestyle and getting preventive care are important in promoting health and wellness.  Follow your health care provider's instructions about healthy diet, exercising, and getting tested or screened for diseases.  Follow your health care provider's instructions on monitoring your cholesterol and blood pressure. This information is not intended to replace advice given to you by your health care provider. Make sure you discuss any questions you have with your health care provider. Document Revised: 04/30/2018 Document Reviewed: 04/30/2018 Elsevier Patient Education  2021  [employer].

## 2020-06-15 ENCOUNTER — Telehealth: Payer: Self-pay | Admitting: Family Medicine

## 2020-06-15 ENCOUNTER — Encounter: Payer: BC Managed Care – PPO | Admitting: Family Medicine

## 2020-06-15 DIAGNOSIS — Z13220 Encounter for screening for lipoid disorders: Secondary | ICD-10-CM

## 2020-06-15 DIAGNOSIS — Z131 Encounter for screening for diabetes mellitus: Secondary | ICD-10-CM

## 2020-06-15 DIAGNOSIS — Z1159 Encounter for screening for other viral diseases: Secondary | ICD-10-CM

## 2020-06-15 DIAGNOSIS — Z125 Encounter for screening for malignant neoplasm of prostate: Secondary | ICD-10-CM

## 2020-06-15 DIAGNOSIS — Z130 Encounter for screening for diseases of the blood and blood-forming organs and certain disorders involving the immune mechanism: Secondary | ICD-10-CM

## 2020-06-15 NOTE — Telephone Encounter (Signed)
Ok to transfer. 

## 2020-06-15 NOTE — Telephone Encounter (Signed)
Patient is requesting a TOC from Dr Lorelei Pont to [employer]. Patient prefers a male provider.

## 2020-06-15 NOTE — Telephone Encounter (Signed)
Ok with me - Lavella Hammock

## 2020-06-22 ENCOUNTER — Ambulatory Visit (INDEPENDENT_AMBULATORY_CARE_PROVIDER_SITE_OTHER): Payer: BC Managed Care – PPO | Admitting: Medical

## 2020-06-22 ENCOUNTER — Other Ambulatory Visit: Payer: Self-pay

## 2020-06-22 ENCOUNTER — Encounter: Payer: Self-pay | Admitting: Medical

## 2020-06-22 VITALS — BP 120/74 | HR 60 | Temp 98.5°F | Resp 20 | Ht 68.0 in | Wt 174.0 lb

## 2020-06-22 DIAGNOSIS — Z0000 Encounter for general adult medical examination without abnormal findings: Secondary | ICD-10-CM | POA: Diagnosis not present

## 2020-06-22 DIAGNOSIS — Z125 Encounter for screening for malignant neoplasm of prostate: Secondary | ICD-10-CM | POA: Diagnosis not present

## 2020-06-22 DIAGNOSIS — R5383 Other fatigue: Secondary | ICD-10-CM

## 2020-06-22 DIAGNOSIS — Z1211 Encounter for screening for malignant neoplasm of colon: Secondary | ICD-10-CM | POA: Diagnosis not present

## 2020-06-22 DIAGNOSIS — Z23 Encounter for immunization: Secondary | ICD-10-CM | POA: Diagnosis not present

## 2020-06-22 DIAGNOSIS — Z1283 Encounter for screening for malignant neoplasm of skin: Secondary | ICD-10-CM

## 2020-06-22 NOTE — Patient Instructions (Addendum)
For you wellness exam today I have ordered cbc, cmp, psa and lipid panel.  For fatigue will get b12, b1, vit d, tsh and t4.  Vaccine given today tdap and flu vaccine today.  Recommend exercise and healthy diet.  We will let you know lab results as they come in.  Follow up date appointment will be determined after lab review.   I would recommend that you do skin cancer screening. Will place referral.  Preventive Care 52-52 Years Old, Male Preventive care refers to lifestyle choices and visits with your health care provider that can promote health and wellness. This includes:  A yearly physical exam. This is also called an annual wellness visit.  Regular dental and eye exams.  Immunizations.  Screening for certain conditions.  Healthy lifestyle choices, such as: ? Eating a healthy diet. ? Getting regular exercise. ? Not using drugs or products that contain nicotine and tobacco. ? Limiting alcohol use. What can I expect for my preventive care visit? Physical exam Your health care provider will check your:  Height and weight. These may be used to calculate your BMI (body mass index). BMI is a measurement that tells if you are at a healthy weight.  Heart rate and blood pressure.  Body temperature.  Skin for abnormal spots. Counseling Your health care provider may ask you questions about your:  Past medical problems.  Family's medical history.  Alcohol, tobacco, and drug use.  Emotional well-being.  Home life and relationship well-being.  Sexual activity.  Diet, exercise, and sleep habits.  Work and work Statistician.  Access to firearms. What immunizations do I need? Vaccines are usually given at various ages, according to a schedule. Your health care provider will recommend vaccines for you based on your age, medical history, and lifestyle or other factors, such as travel or where you work.   What tests do I need? Blood tests  Lipid and cholesterol levels.  These may be checked every 5 years, or more often if you are over 72 years old.  Hepatitis C test.  Hepatitis B test. Screening  Lung cancer screening. You may have this screening every year starting at age 52 if you have a 30-pack-year history of smoking and currently smoke or have quit within the past 15 years.  Prostate cancer screening. Recommendations will vary depending on your family history and other risks.  Genital exam to check for testicular cancer or hernias.  Colorectal cancer screening. ? All adults should have this screening starting at age 52 and continuing until age 28. ? Your health care provider may recommend screening at age 52 if you are at increased risk. ? You will have tests every 1-10 years, depending on your results and the type of screening test.  Diabetes screening. ? This is done by checking your blood sugar (glucose) after you have not eaten for a while (fasting). ? You may have this done every 1-3 years.  STD (sexually transmitted disease) testing, if you are at risk. Follow these instructions at home: Eating and drinking  Eat a diet that includes fresh fruits and vegetables, whole grains, lean protein, and low-fat dairy products.  Take vitamin and mineral supplements as recommended by your health care provider.  Do not drink alcohol if your health care provider tells you not to drink.  If you drink alcohol: ? Limit how much you have to 0-2 drinks a day. ? Be aware of how much alcohol is in your drink. In the [place] one drink  equals one 12 oz bottle of beer (355 mL), one 5 oz glass of wine (148 mL), or one 1 oz glass of hard liquor (44 mL).   Lifestyle  Take daily care of your teeth and gums. Brush your teeth every morning and night with fluoride toothpaste. Floss one time each day.  Stay active. Exercise for at least 30 minutes 5 or more days each week.  Do not use any products that contain nicotine or tobacco, such as cigarettes, e-cigarettes,  and chewing tobacco. If you need help quitting, ask your health care provider.  Do not use drugs.  If you are sexually active, practice safe sex. Use a condom or other form of protection to prevent STIs (sexually transmitted infections).  If told by your health care provider, take low-dose aspirin daily starting at age 40.  Find healthy ways to cope with stress, such as: ? Meditation, yoga, or listening to music. ? Journaling. ? Talking to a trusted person. ? Spending time with friends and family. Safety  Always wear your seat belt while driving or riding in a vehicle.  Do not drive: ? If you have been drinking alcohol. Do not ride with someone who has been drinking. ? When you are tired or distracted. ? While texting.  Wear a helmet and other protective equipment during sports activities.  If you have firearms in your house, make sure you follow all gun safety procedures. What's next?  Go to your health care provider once a year for an annual wellness visit.  Ask your health care provider how often you should have your eyes and teeth checked.  Stay up to date on all vaccines. This information is not intended to replace advice given to you by your health care provider. Make sure you discuss any questions you have with your health care provider. Document Revised: 02/03/2019 Document Reviewed: 05/01/2018 Elsevier Patient Education  2021 [employer].

## 2020-06-22 NOTE — Progress Notes (Signed)
Subjective:    Patient ID: Juan Collier, male    DOB: [birth year], 50 y.o.   MRN: [id number]  HPI  Pt in wellness exam.  Pt is not fasting. He wants flu vaccine.  Pt is exercising/jogging regularly. Jogging 3 days a week. 15-18 miles a week.  Pt states his diet is good but eating more. Non smoking. 2 beers on weekends.  Pt gained about 20 lbs. Pt states eating a lot more.  Pt got covid vaccine and booster.   Review of Systems  Constitutional: Positive for fatigue. Negative for chills and fever.  HENT: Negative for congestion, drooling and ear discharge.   Respiratory: Negative for cough, chest tightness, shortness of breath and wheezing.   Cardiovascular: Negative for chest pain and palpitations.  Gastrointestinal: Negative for abdominal pain.  Genitourinary: Negative for dysuria, flank pain, frequency, penile pain, testicular pain and urgency.  Musculoskeletal: Negative for back pain.  Skin: Negative for rash.  Neurological: Negative for dizziness, speech difficulty, weakness, numbness and headaches.  Hematological: Negative for adenopathy. Does not bruise/bleed easily.  Psychiatric/Behavioral: Negative for behavioral problems and confusion.     Past Medical History:  Diagnosis Date  . Allergy   . History of fainting spells of unknown cause   . History of stomach ulcers   . Rheumatic fever   . Tuberculosis positve      Social History   Socioeconomic History  . Marital status: Married    Spouse name: Not on file  . Number of children: Not on file  . Years of education: Not on file  . Highest education level: Not on file  Occupational History  . Not on file  Tobacco Use  . Smoking status: Never Smoker  . Smokeless tobacco: Never Used  Substance and Sexual Activity  . Alcohol use: Yes    Alcohol/week: 2.0 standard drinks    Types: 2 Cans of beer per week  . Drug use: No  . Sexual activity: Yes    Birth control/protection: None  Other Topics Concern   . Not on file  Social History Narrative  . Not on file   Social Determinants of Health   Financial Resource Strain: Not on file  Food Insecurity: Not on file  Transportation Needs: Not on file  Physical Activity: Not on file  Stress: Not on file  Social Connections: Not on file  Intimate Partner Violence: Not on file    No past surgical history on file.  Family History  Problem Relation Age of Onset  . Thyroid disease Daughter   . ADD / ADHD Son   . Bipolar disorder Maternal Grandmother     Allergies  Allergen Reactions  . Mold Extract [Trichophyton]   . [facility] [Quercus Robur]   . Pollen Extract     No current outpatient medications on file prior to visit.   No current facility-administered medications on file prior to visit.    BP 120/74   Pulse 60   Temp 98.5 F (36.9 C) (Oral)   Resp 20   Ht 5\' 8"  (1.727 m)   Wt 174 lb (78.9 kg)   SpO2 100%   BMI 26.46 kg/m       Objective:   Physical Exam  General Mental Status- Alert. General Appearance- Not in acute distress.   Skin   Moisture- Normal Moisture. Scattered small freckls and moles.    Neck Carotid Arteries- Normal color. Moisture- Normal Moisture. No carotid bruits. No JVD.  Chest and Lung  Exam Auscultation: Breath Sounds:-Normal.  Cardiovascular Auscultation:Rythm- Regular. Murmurs & Other Heart Sounds:Auscultation of the heart reveals- No Murmurs.  Abdomen Inspection:-Inspeection Normal. Palpation/Percussion:Note:No mass. Palpation and Percussion of the abdomen reveal- Non Tender, Non Distended + BS, no rebound or guarding.  Neurologic Cranial Nerve exam:- CN III-XII intact(No nystagmus), symmetric smile. Strength:- 5/5 equal and symmetric strength both upper and lower extremities.      Assessment & Plan:  For you wellness exam today I have ordered cbc, cmp, psa and  lipid panel.  For fatigue will get b12, b1, vit d, tsh and t4.  Vaccine given today tdap and flu vaccine  today.  Recommend exercise and healthy diet.  We will let you know lab results as they come in.  Follow up date appointment will be determined after lab review.     Mackie Pai, PA-C

## 2020-06-23 ENCOUNTER — Other Ambulatory Visit (INDEPENDENT_AMBULATORY_CARE_PROVIDER_SITE_OTHER): Payer: BC Managed Care – PPO

## 2020-06-23 DIAGNOSIS — Z0000 Encounter for general adult medical examination without abnormal findings: Secondary | ICD-10-CM

## 2020-06-23 DIAGNOSIS — R5383 Other fatigue: Secondary | ICD-10-CM | POA: Diagnosis not present

## 2020-06-23 DIAGNOSIS — Z125 Encounter for screening for malignant neoplasm of prostate: Secondary | ICD-10-CM | POA: Diagnosis not present

## 2020-06-23 LAB — TSH: TSH: 2.53 u[IU]/mL (ref 0.35–4.50)

## 2020-06-23 LAB — LIPID PANEL
Cholesterol: 195 mg/dL (ref 0–200)
HDL: 48 mg/dL (ref >39.00)
NonHDL: 146.98
Total CHOL/HDL Ratio: 4
Triglycerides: 201 mg/dL — ABNORMAL HIGH (ref 0.0–149.0)
VLDL: 40.2 mg/dL — ABNORMAL HIGH (ref 0.0–40.0)

## 2020-06-23 LAB — COMPREHENSIVE METABOLIC PANEL
ALT: 19 U/L (ref 0–53)
AST: 21 U/L (ref 0–37)
Albumin: 4.4 g/dL (ref 3.5–5.2)
Alkaline Phosphatase: 48 U/L (ref 39–117)
BUN: 12 mg/dL (ref 6–23)
CO2: 30 mEq/L (ref 19–32)
Calcium: 9.5 mg/dL (ref 8.4–10.5)
Chloride: 102 mEq/L (ref 96–112)
Creatinine, Ser: 0.89 mg/dL (ref 0.40–1.50)
GFR: 99.26 mL/min (ref >60.00)
Glucose, Bld: 86 mg/dL (ref 70–99)
Potassium: 4.4 mEq/L (ref 3.5–5.1)
Sodium: 137 mEq/L (ref 135–145)
Total Bilirubin: 0.5 mg/dL (ref 0.2–1.2)
Total Protein: 7.4 g/dL (ref 6.0–8.3)

## 2020-06-23 LAB — CBC WITH DIFFERENTIAL/PLATELET
Basophils Absolute: 0 10*3/uL (ref 0.0–0.1)
Basophils Relative: 0.4 % (ref 0.0–3.0)
Eosinophils Absolute: 0.2 10*3/uL (ref 0.0–0.7)
Eosinophils Relative: 2.6 % (ref 0.0–5.0)
HCT: 46.4 % (ref 39.0–52.0)
Hemoglobin: 15.6 g/dL (ref 13.0–17.0)
Lymphocytes Relative: 21.8 % (ref 12.0–46.0)
Lymphs Abs: 1.4 10*3/uL (ref 0.7–4.0)
MCHC: 33.7 g/dL (ref 30.0–36.0)
MCV: 92.5 fl (ref 78.0–100.0)
Monocytes Absolute: 0.5 10*3/uL (ref 0.1–1.0)
Monocytes Relative: 8.4 % (ref 3.0–12.0)
Neutro Abs: 4.2 10*3/uL (ref 1.4–7.7)
Neutrophils Relative %: 66.8 % (ref 43.0–77.0)
Platelets: 246 10*3/uL (ref 150.0–400.0)
RBC: 5.01 Mil/uL (ref 4.22–5.81)
RDW: 13.2 % (ref 11.5–15.5)
WBC: 6.2 10*3/uL (ref 4.0–10.5)

## 2020-06-23 LAB — T4, FREE: Free T4: 0.82 ng/dL (ref 0.60–1.60)

## 2020-06-23 LAB — PSA: PSA: 1.48 ng/mL (ref 0.10–4.00)

## 2020-06-23 LAB — LDL CHOLESTEROL, DIRECT: Direct LDL: 109 mg/dL

## 2020-06-23 LAB — VITAMIN B12: Vitamin B-12: 338 pg/mL (ref 211–911)

## 2020-06-28 LAB — VITAMIN D 1,25 DIHYDROXY
Vitamin D 1, 25 (OH)2 Total: 44 pg/mL (ref 18–72)
Vitamin D2 1, 25 (OH)2: <8 pg/mL
Vitamin D3 1, 25 (OH)2: 44 pg/mL

## 2020-06-28 LAB — VITAMIN B1: Vitamin B1 (Thiamine): 10 nmol/L (ref 8–30)

## 2020-07-11 IMAGING — US US SCROTUM W/ DOPPLER COMPLETE
1 series · 13 of 25 positions shown · non-contrast
Comparison: None.

CLINICAL DATA: Palpable lump over the right testicle. This has been
present for a long time although has change shape recently and
become painful over the past month. History of previous epididymitis
June 2018.

EXAM:
SCROTAL ULTRASOUND
DOPPLER ULTRASOUND OF THE TESTICLES
TECHNIQUE: Complete ultrasound examination of the testicles, epididymis, and
other scrotal structures was performed. Color and spectral Doppler
ultrasound were also utilized to evaluate blood flow to the
testicles.

[Series 1: us scrotum w/ doppler complete · 13 of 103 slices shown]
[im 1/103]
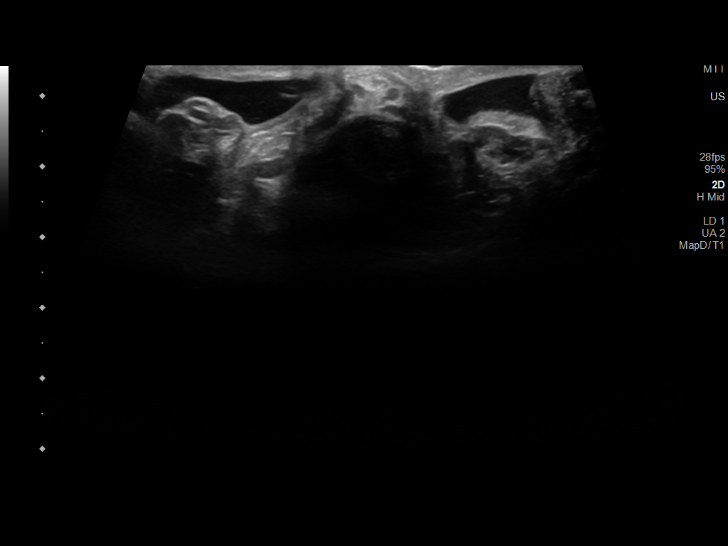
[im 9/103]
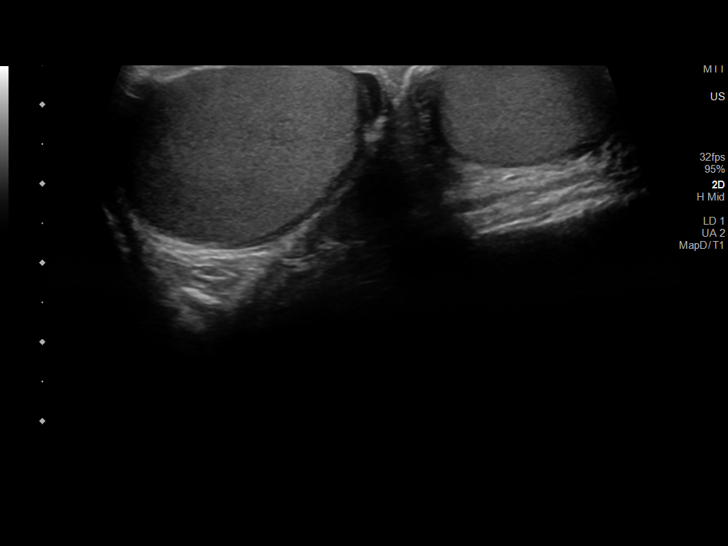
[im 18/103]
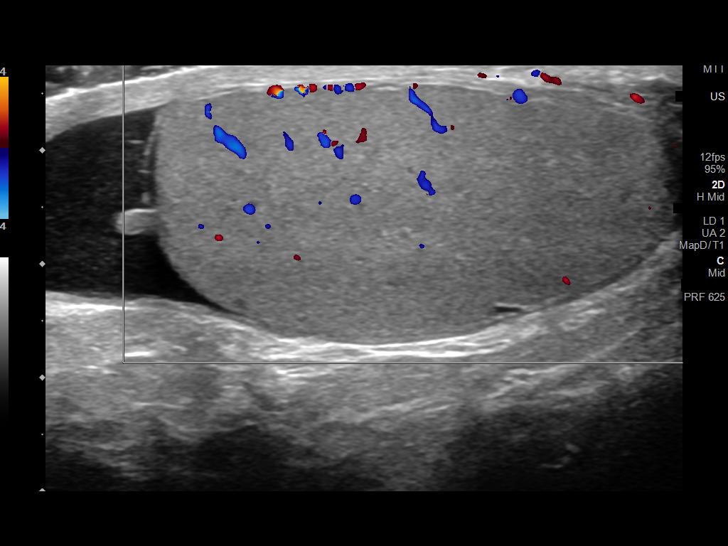
[im 26/103]
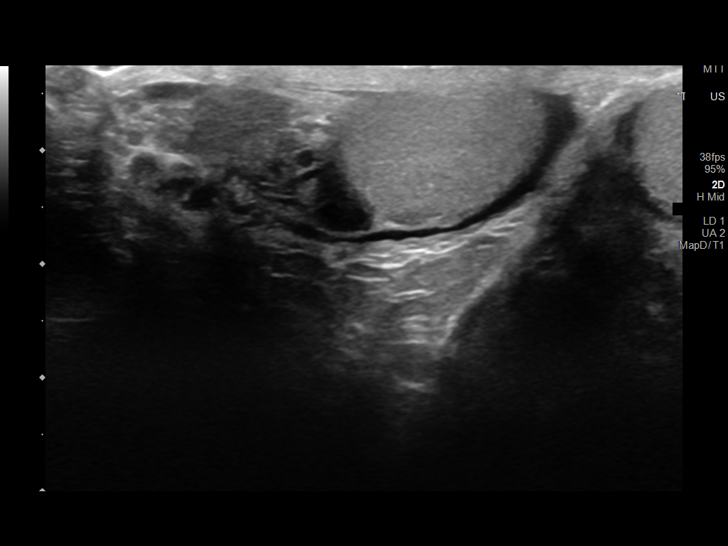
[im 35/103]
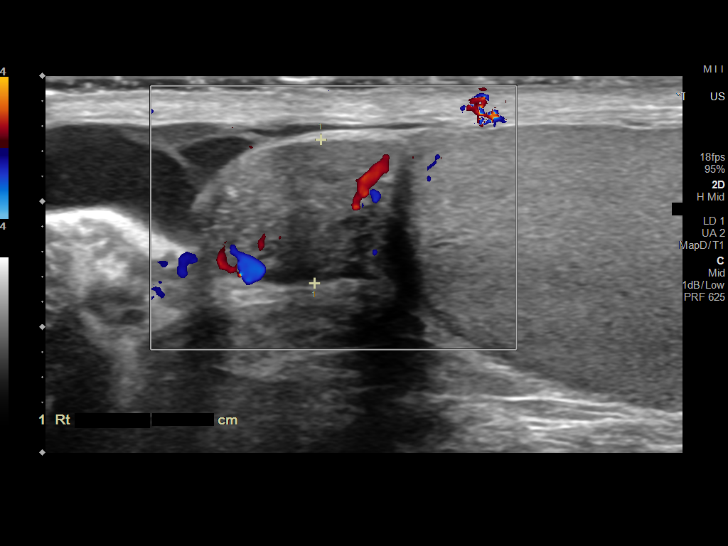
[im 43/103]
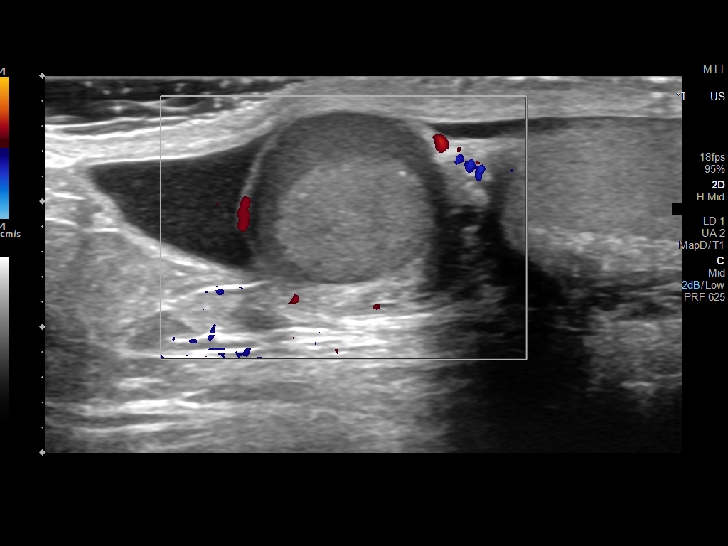
[im 52/103]
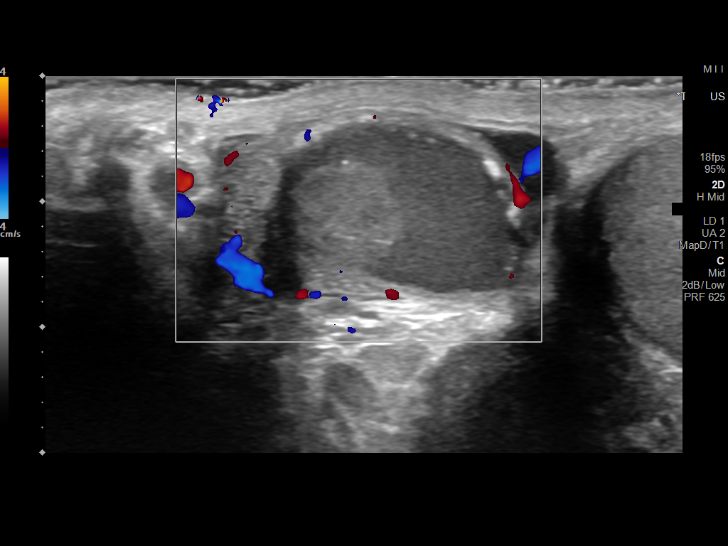
[im 60/103]
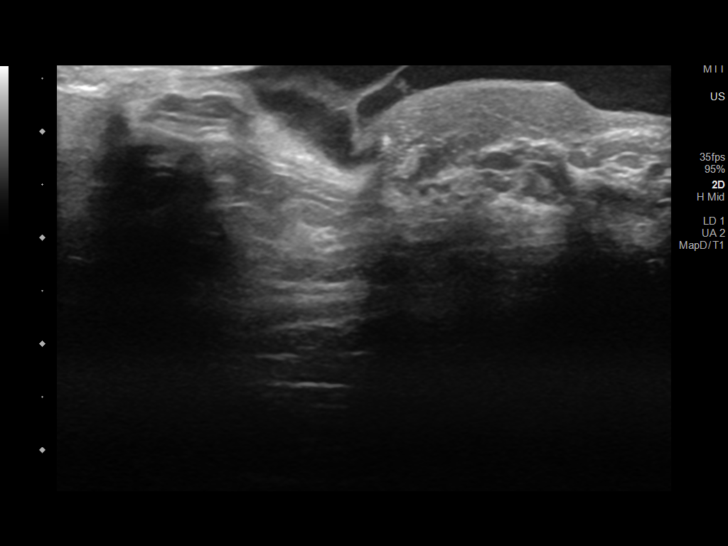
[im 69/103]
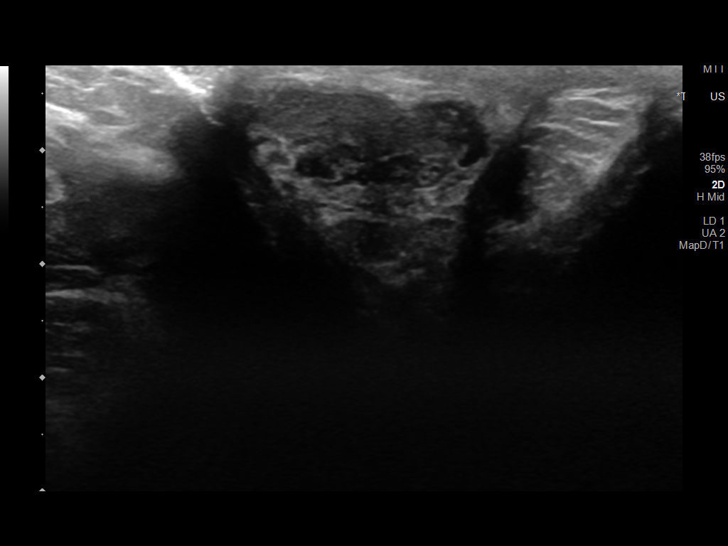
[im 77/103]
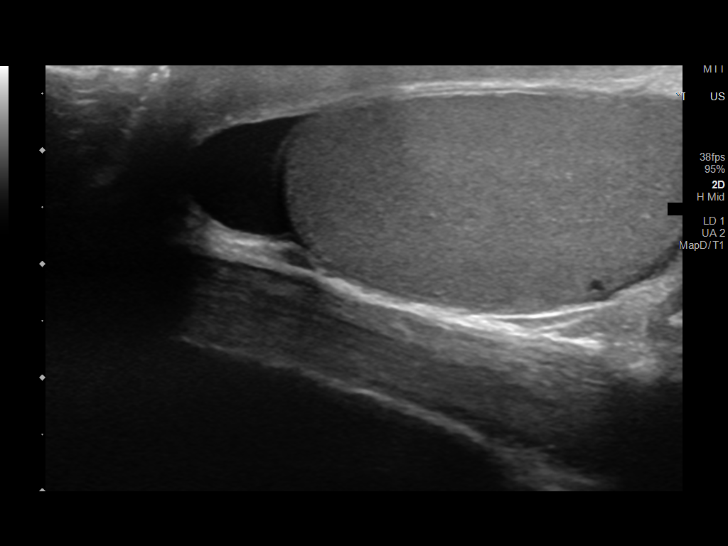
[im 86/103]
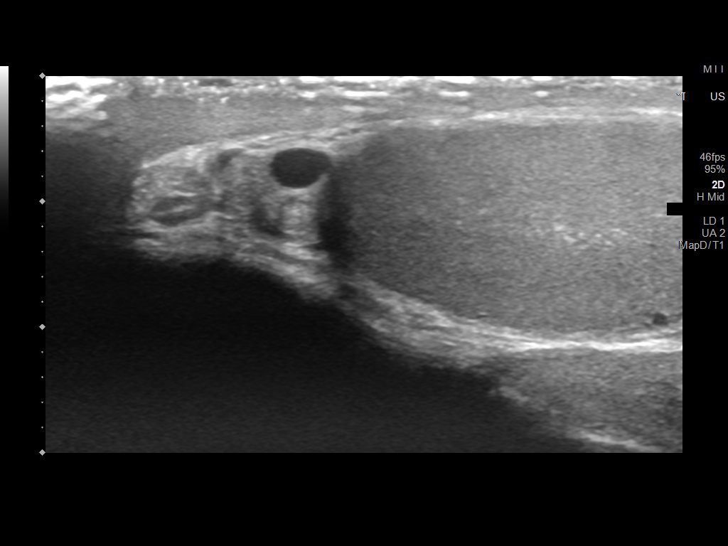
[im 94/103]
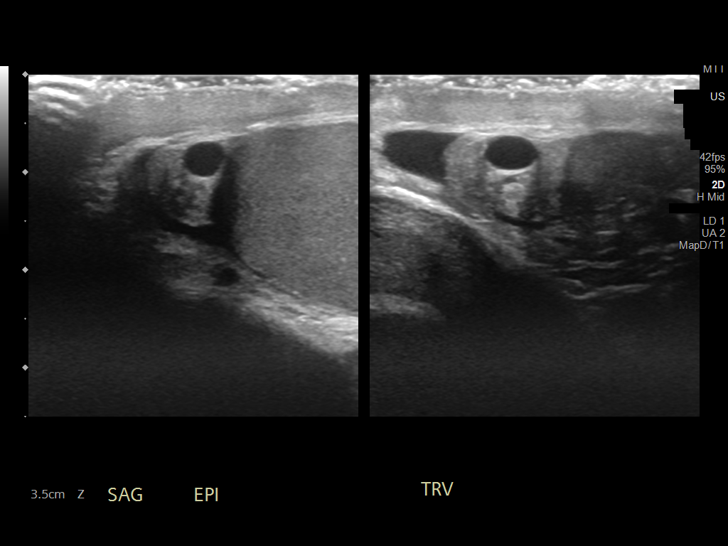
[im 103/103]
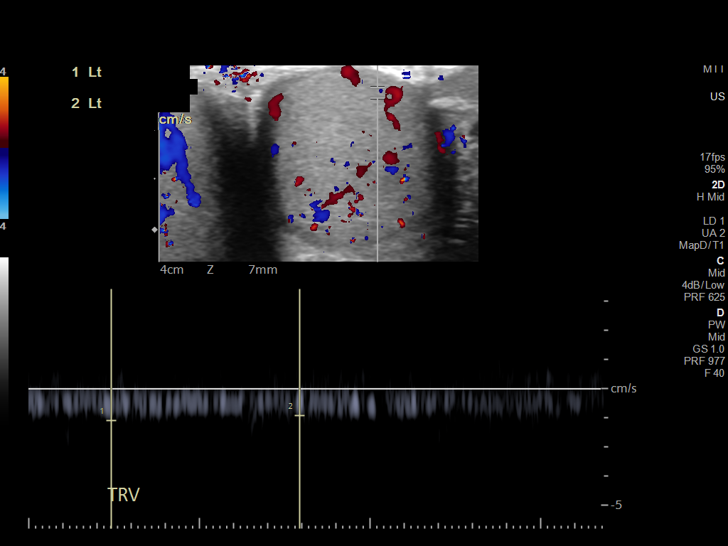

[13 of 25 positions shown; findings below may reference images not displayed]

FINDINGS: Right testicle

Measurements: 4.7 x 2.3 x 3.0 cm. No mass or microlithiasis
visualized.

Left testicle

Measurements: 4.3 x 2.5 x 3.4 cm. No mass or microlithiasis
visualized.

Right epididymis: Mass over the epididymal head measuring 1.5 x
x 2.2 cm which contains complicated cystic fluid with oval more
echogenic 1.2 cm mural nodule. No significant internal vascularity.
Normal vascularity of the adjacent epididymis.

Left epididymis: 6 mm cyst over the epididymal head. Normal
vascularity.

Hydrocele:  Small bilateral hydroceles right greater than left.

Varicocele:  None visualized.

Pulsed Doppler interrogation of both testes demonstrates normal
symmetric low resistance arterial and venous waveforms bilaterally.
IMPRESSION: 1. 2.2 cm complex cystic mass over the head of the right epididymis.
Appearance is indeterminate and could be seen with
infectious/inflammatory causes as well as granulomatous disease or
primary versus metastatic neoplasm. Consider urologic consultation.

2. Normal testicles with normal symmetric vascular flow. 6 mm left
epididymal cyst.

3.  Small bilateral hydroceles right worse than left.

## 2020-07-11 IMAGING — US US SCROTUM
1 series · 13 of 25 positions shown · non-contrast
Comparison: None.

CLINICAL DATA: Palpable lump over the right testicle. This has been
present for a long time although has change shape recently and
become painful over the past month. History of previous epididymitis
June 2018.

EXAM:
SCROTAL ULTRASOUND
DOPPLER ULTRASOUND OF THE TESTICLES
TECHNIQUE: Complete ultrasound examination of the testicles, epididymis, and
other scrotal structures was performed. Color and spectral Doppler
ultrasound were also utilized to evaluate blood flow to the
testicles.

[Series 1: us scrotum · 13 of 103 slices shown]
[im 1/103]
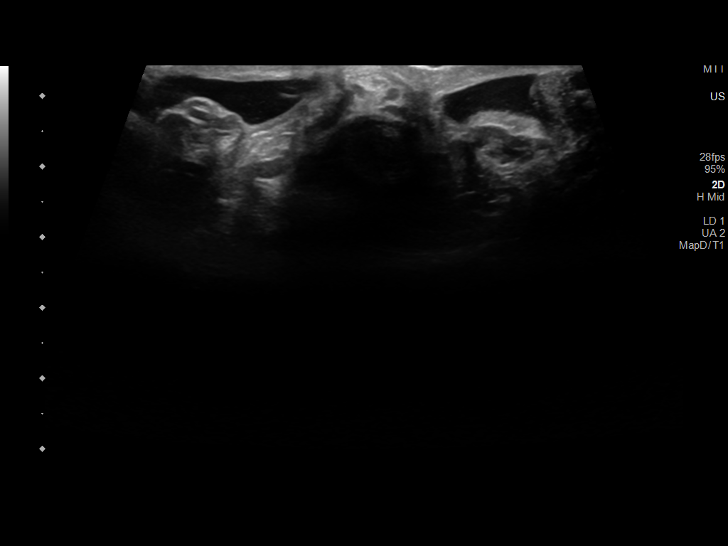
[im 9/103]
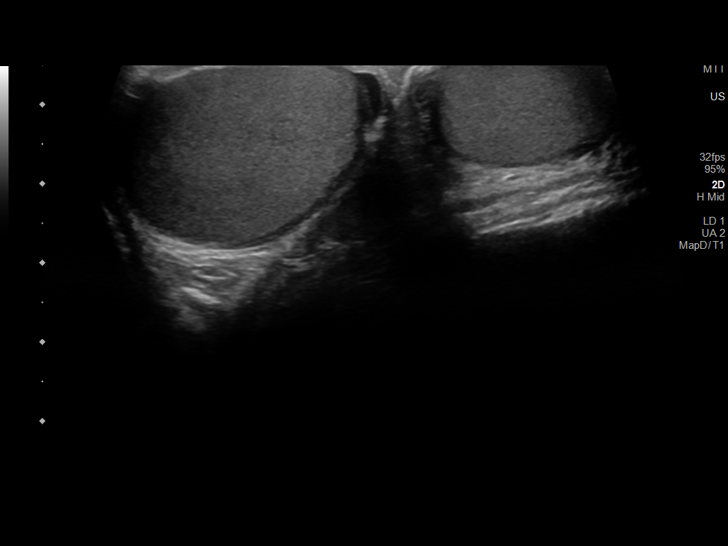
[im 18/103]
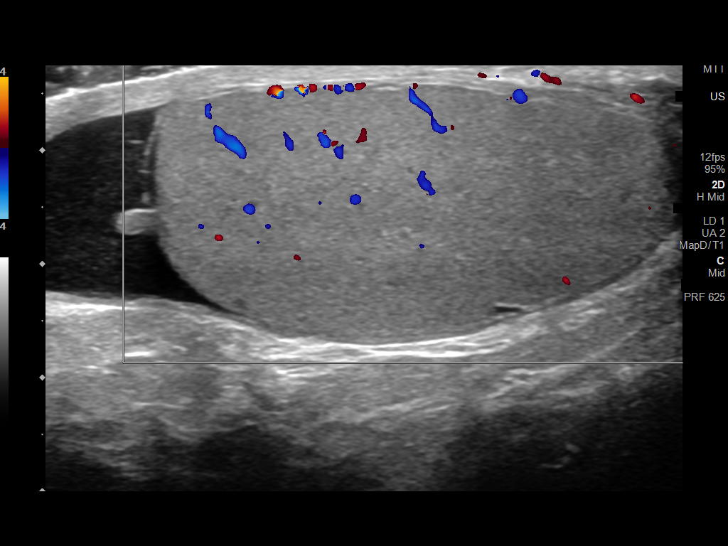
[im 26/103]
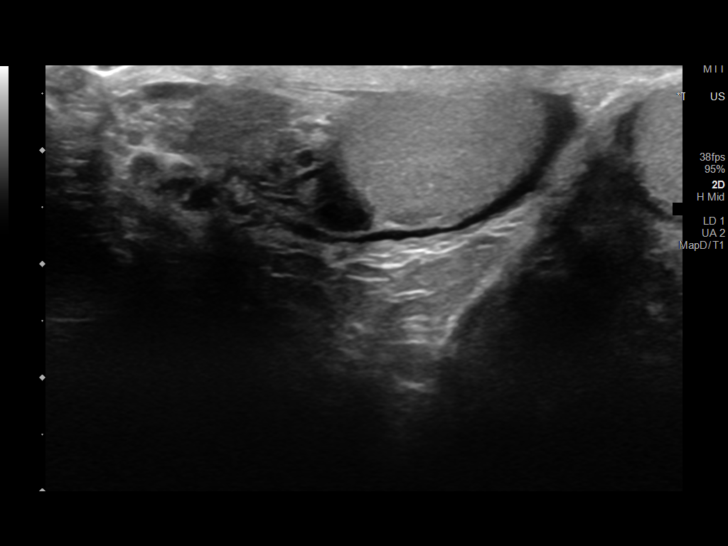
[im 35/103]
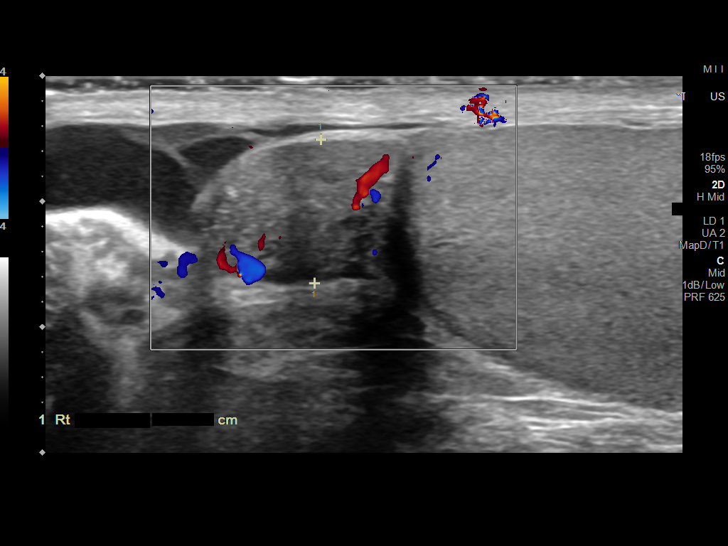
[im 43/103]
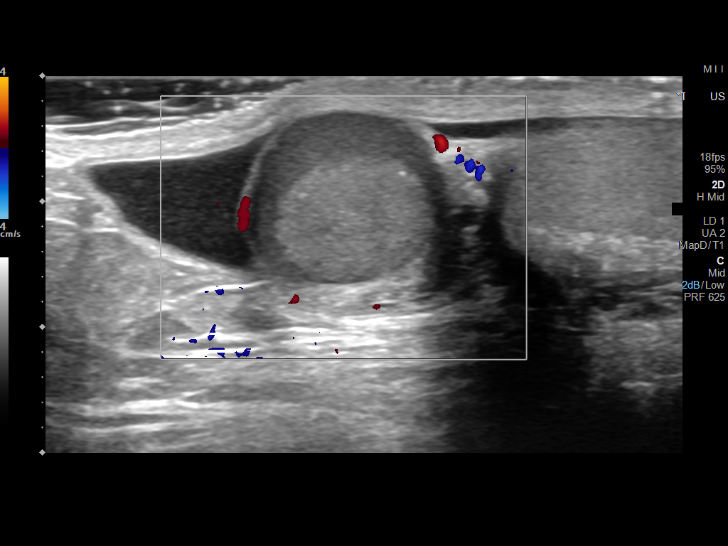
[im 52/103]
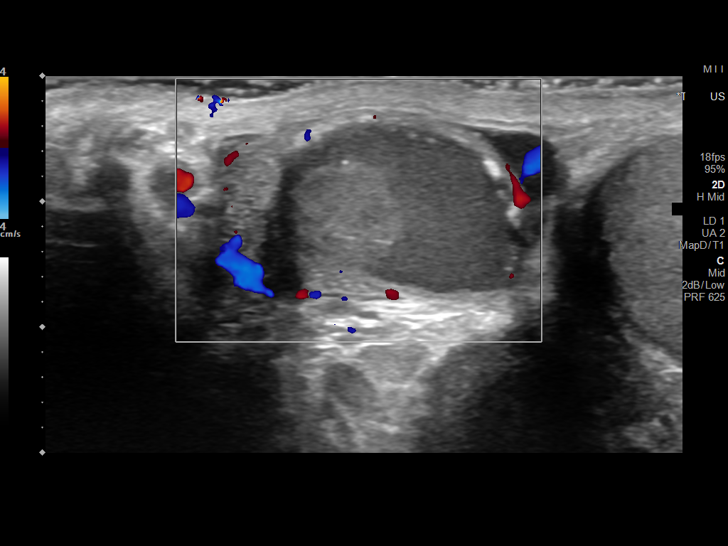
[im 60/103]
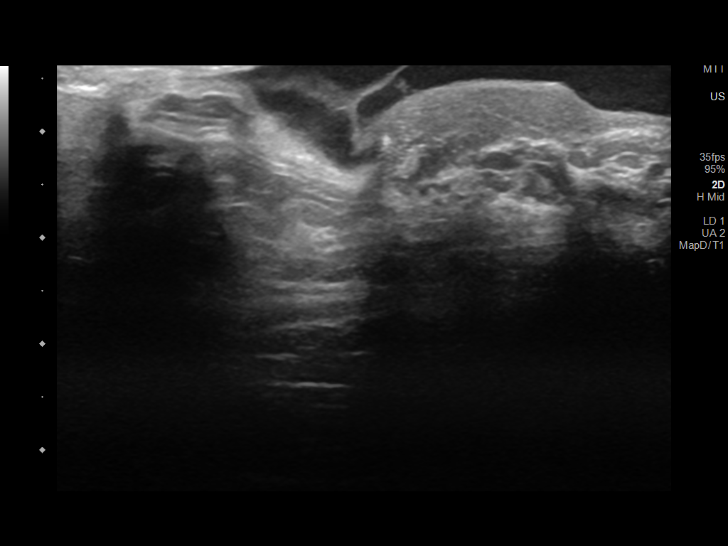
[im 69/103]
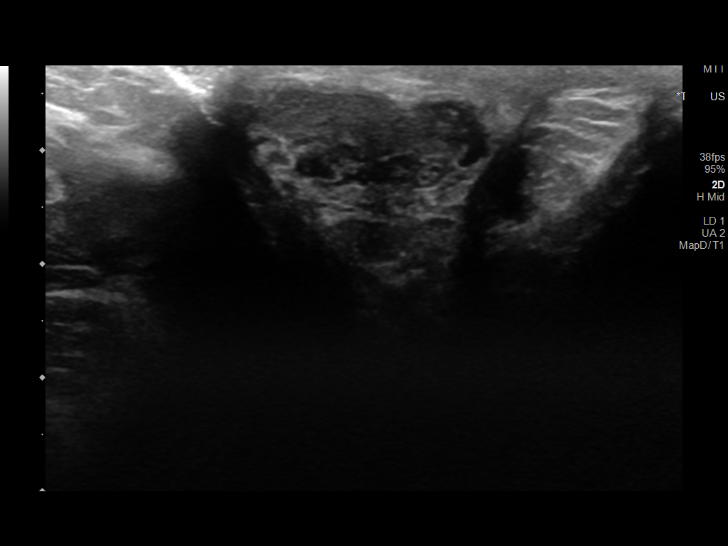
[im 77/103]
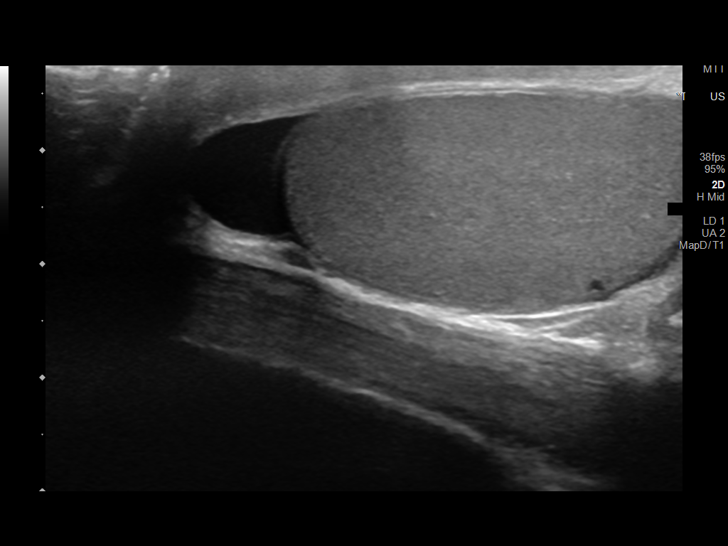
[im 86/103]
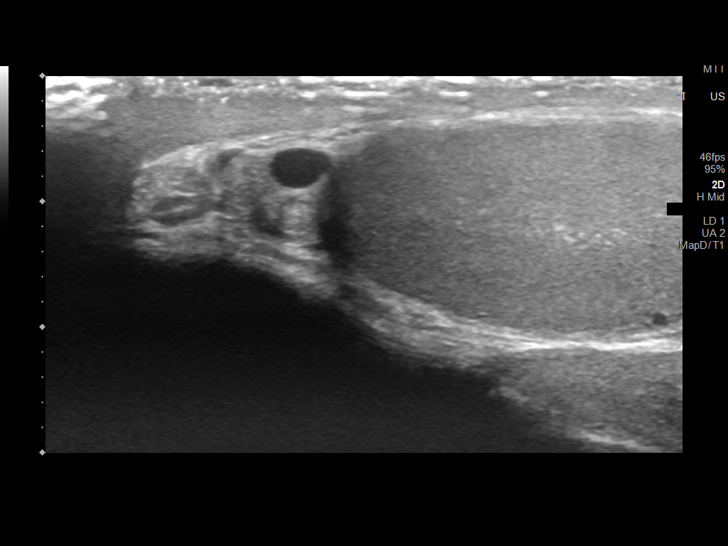
[im 94/103]
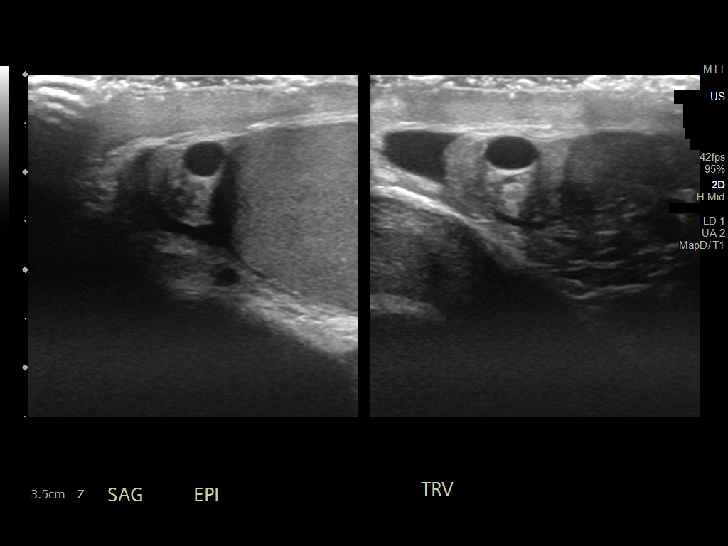
[im 103/103]
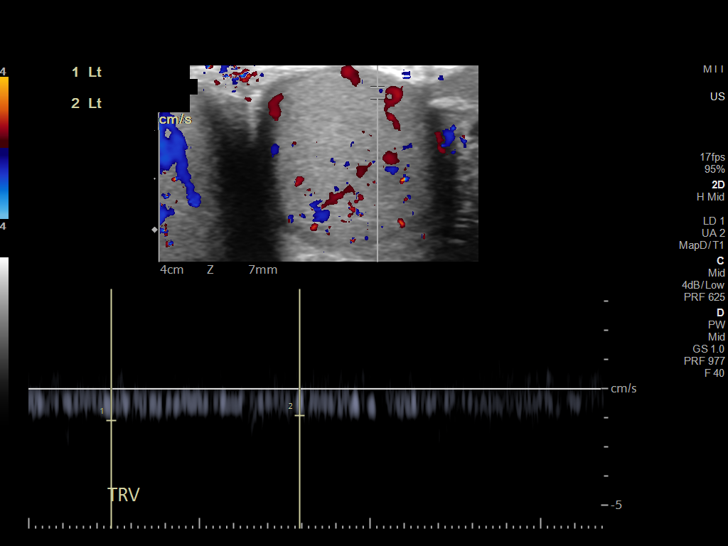

[13 of 25 positions shown; findings below may reference images not displayed]

FINDINGS: Right testicle

Measurements: 4.7 x 2.3 x 3.0 cm. No mass or microlithiasis
visualized.

Left testicle

Measurements: 4.3 x 2.5 x 3.4 cm. No mass or microlithiasis
visualized.

Right epididymis: Mass over the epididymal head measuring 1.5 x
x 2.2 cm which contains complicated cystic fluid with oval more
echogenic 1.2 cm mural nodule. No significant internal vascularity.
Normal vascularity of the adjacent epididymis.

Left epididymis: 6 mm cyst over the epididymal head. Normal
vascularity.

Hydrocele:  Small bilateral hydroceles right greater than left.

Varicocele:  None visualized.

Pulsed Doppler interrogation of both testes demonstrates normal
symmetric low resistance arterial and venous waveforms bilaterally.
IMPRESSION: 1. 2.2 cm complex cystic mass over the head of the right epididymis.
Appearance is indeterminate and could be seen with
infectious/inflammatory causes as well as granulomatous disease or
primary versus metastatic neoplasm. Consider urologic consultation.

2. Normal testicles with normal symmetric vascular flow. 6 mm left
epididymal cyst.

3.  Small bilateral hydroceles right worse than left.

## 2021-07-20 NOTE — Patient Instructions (Signed)
Good to see you again today, I will be in touch with your labs ?Recommend COVID-19 booster if not up-to-date ?

## 2021-07-20 NOTE — Progress Notes (Deleted)
Therapist, music at [facility] ?[address], [address] ?[place], [place] [address] ?336 [phone] ?Fax [phone] ? ?Date:  07/24/2021  ? ?Name:  Juan Collier   DOB:  Feb 05, 1971   MRN:  [id number] ? ?PCP:  Darreld Mclean, MD  ? ? ?Chief Complaint: No chief complaint on file. ? ? ?History of Present Illness: ? ?Juan Collier is a 51 y.o. very pleasant male patient who presents with the following: ? ?Patient seen today for a physical exam ?Most recent visit with myself was in 2017 ?He has history of rheumatic fever as a teen which caused some knee damage but not heart damage ? ?Colon cancer screening ?Shingrix ?COVID booster ?Flu shot ?Need to update lab work ? ?Patient Active Problem List  ? Diagnosis Date Noted  ? Left shoulder pain 07/02/2016  ? WRIST PAIN, LEFT 07/13/2010  ? BACK PAIN 07/13/2010  ? ? ?Past Medical History:  ?Diagnosis Date  ? Allergy   ? History of fainting spells of unknown cause   ? History of stomach ulcers   ? Rheumatic fever   ? Tuberculosis positve   ? ? ?No past surgical history on file. ? ?Social History  ? ?Tobacco Use  ? Smoking status: Never  ? Smokeless tobacco: Never  ?Substance Use Topics  ? Alcohol use: Yes  ?  Alcohol/week: 2.0 standard drinks  ?  Types: 2 Cans of beer per week  ? Drug use: No  ? ? ?Family History  ?Problem Relation Age of Onset  ? Thyroid disease Daughter   ? ADD / ADHD Son   ? Bipolar disorder Maternal Grandmother   ? ? ?Allergies  ?Allergen Reactions  ? Mold Extract [Trichophyton]   ? [facility] [Quercus Robur]   ? Pollen Extract   ? ? ?Medication list has been reviewed and updated. ? ?No current outpatient medications on file prior to visit.  ? ?No current facility-administered medications on file prior to visit.  ? ? ?Review of Systems: ? ?As per HPI- otherwise negative. ? ? ?Physical Examination: ?There were no vitals filed for this visit. ?There were no vitals filed for this visit. ?There is no height or weight on file to calculate  BMI. ?Ideal Body Weight:   ? ?GEN: no acute distress. ?HEENT: Atraumatic, Normocephalic.  ?Ears and Nose: No external deformity. ?CV: RRR, No M/G/R. No JVD. No thrill. No extra heart sounds. ?PULM: CTA B, no wheezes, crackles, rhonchi. No retractions. No resp. distress. No accessory muscle use. ?ABD: S, NT, ND, +BS. No rebound. No HSM. ?EXTR: No c/c/e ?PSYCH: Normally interactive. Conversant.  ? ? ?Assessment and Plan: ?*** ?Physical exam today.  Encouraged healthy diet and exercise routine ?Will plan further follow- up pending labs. ? ?Signed ?Lamar Blinks, MD ? ?

## 2021-07-24 ENCOUNTER — Encounter: Payer: Self-pay | Admitting: Medical

## 2021-07-24 ENCOUNTER — Ambulatory Visit: Payer: Self-pay | Admitting: Family Medicine

## 2021-07-24 ENCOUNTER — Encounter: Payer: BC Managed Care – PPO | Admitting: Family Medicine

## 2021-07-24 VITALS — Wt 178.8 lb

## 2021-07-24 DIAGNOSIS — Z125 Encounter for screening for malignant neoplasm of prostate: Secondary | ICD-10-CM

## 2021-07-24 DIAGNOSIS — Z13220 Encounter for screening for lipoid disorders: Secondary | ICD-10-CM

## 2021-07-24 DIAGNOSIS — Z0000 Encounter for general adult medical examination without abnormal findings: Secondary | ICD-10-CM

## 2021-07-24 DIAGNOSIS — Z1211 Encounter for screening for malignant neoplasm of colon: Secondary | ICD-10-CM

## 2021-07-24 DIAGNOSIS — Z131 Encounter for screening for diabetes mellitus: Secondary | ICD-10-CM

## 2021-07-24 DIAGNOSIS — Z130 Encounter for screening for diseases of the blood and blood-forming organs and certain disorders involving the immune mechanism: Secondary | ICD-10-CM

## 2021-07-24 DIAGNOSIS — Z538 Procedure and treatment not carried out for other reasons: Secondary | ICD-10-CM

## 2021-07-25 ENCOUNTER — Ambulatory Visit (INDEPENDENT_AMBULATORY_CARE_PROVIDER_SITE_OTHER): Payer: BC Managed Care – PPO | Admitting: Medical

## 2021-07-25 VITALS — BP 125/77 | HR 75 | Temp 98.2°F | Resp 18 | Ht 68.0 in | Wt 176.4 lb

## 2021-07-25 DIAGNOSIS — Z1211 Encounter for screening for malignant neoplasm of colon: Secondary | ICD-10-CM | POA: Diagnosis not present

## 2021-07-25 DIAGNOSIS — M25512 Pain in left shoulder: Secondary | ICD-10-CM

## 2021-07-25 DIAGNOSIS — G8929 Other chronic pain: Secondary | ICD-10-CM

## 2021-07-25 DIAGNOSIS — Z23 Encounter for immunization: Secondary | ICD-10-CM | POA: Diagnosis not present

## 2021-07-25 DIAGNOSIS — R5383 Other fatigue: Secondary | ICD-10-CM

## 2021-07-25 DIAGNOSIS — Z125 Encounter for screening for malignant neoplasm of prostate: Secondary | ICD-10-CM | POA: Diagnosis not present

## 2021-07-25 DIAGNOSIS — Z0000 Encounter for general adult medical examination without abnormal findings: Secondary | ICD-10-CM | POA: Diagnosis not present

## 2021-07-25 DIAGNOSIS — M25511 Pain in right shoulder: Secondary | ICD-10-CM | POA: Diagnosis not present

## 2021-07-25 LAB — CBC WITH DIFFERENTIAL/PLATELET
Basophils Absolute: 0 10*3/uL (ref 0.0–0.1)
Basophils Relative: 0.7 % (ref 0.0–3.0)
Eosinophils Absolute: 0.2 10*3/uL (ref 0.0–0.7)
Eosinophils Relative: 3.4 % (ref 0.0–5.0)
HCT: 45.9 % (ref 39.0–52.0)
Hemoglobin: 15.7 g/dL (ref 13.0–17.0)
Lymphocytes Relative: 31.2 % (ref 12.0–46.0)
Lymphs Abs: 1.6 10*3/uL (ref 0.7–4.0)
MCHC: 34.2 g/dL (ref 30.0–36.0)
MCV: 91.8 fl (ref 78.0–100.0)
Monocytes Absolute: 0.3 10*3/uL (ref 0.1–1.0)
Monocytes Relative: 6.1 % (ref 3.0–12.0)
Neutro Abs: 3.1 10*3/uL (ref 1.4–7.7)
Neutrophils Relative %: 58.6 % (ref 43.0–77.0)
Platelets: 281 10*3/uL (ref 150.0–400.0)
RBC: 5.01 Mil/uL (ref 4.22–5.81)
RDW: 13.4 % (ref 11.5–15.5)
WBC: 5.3 10*3/uL (ref 4.0–10.5)

## 2021-07-25 LAB — LIPID PANEL
Cholesterol: 218 mg/dL — ABNORMAL HIGH (ref 0–200)
HDL: 59.9 mg/dL (ref >39.00)
LDL Cholesterol: 132 mg/dL — ABNORMAL HIGH (ref 0–99)
NonHDL: 158.3
Total CHOL/HDL Ratio: 4
Triglycerides: 133 mg/dL (ref 0.0–149.0)
VLDL: 26.6 mg/dL (ref 0.0–40.0)

## 2021-07-25 LAB — VITAMIN B12: Vitamin B-12: 1159 pg/mL — ABNORMAL HIGH (ref 211–911)

## 2021-07-25 LAB — COMPREHENSIVE METABOLIC PANEL
ALT: 28 U/L (ref 0–53)
AST: 22 U/L (ref 0–37)
Albumin: 4.6 g/dL (ref 3.5–5.2)
Alkaline Phosphatase: 49 U/L (ref 39–117)
BUN: 12 mg/dL (ref 6–23)
CO2: 28 mEq/L (ref 19–32)
Calcium: 9.6 mg/dL (ref 8.4–10.5)
Chloride: 103 mEq/L (ref 96–112)
Creatinine, Ser: 0.81 mg/dL (ref 0.40–1.50)
GFR: 101.35 mL/min (ref >60.00)
Glucose, Bld: 100 mg/dL — ABNORMAL HIGH (ref 70–99)
Potassium: 4.3 mEq/L (ref 3.5–5.1)
Sodium: 137 mEq/L (ref 135–145)
Total Bilirubin: 0.5 mg/dL (ref 0.2–1.2)
Total Protein: 7.5 g/dL (ref 6.0–8.3)

## 2021-07-25 LAB — PSA: PSA: 1.84 ng/mL (ref 0.10–4.00)

## 2021-07-25 LAB — TSH: TSH: 1.43 u[IU]/mL (ref 0.35–5.50)

## 2021-07-25 LAB — T4, FREE: Free T4: 0.64 ng/dL (ref 0.60–1.60)

## 2021-07-25 NOTE — Addendum Note (Signed)
Addended by: Jeronimo Greaves on: 07/25/2021 10:10 AM ? ? Modules accepted: Orders ? ?

## 2021-07-25 NOTE — Progress Notes (Signed)
Patient thought he had scheduled an appointment with my partner and did not wish to be seen ?

## 2021-07-25 NOTE — Patient Instructions (Addendum)
For you wellness exam today I have ordered cbc, cmp and lipid panel. ? ?Vaccine given today shingrx. ? ?Recommend exercise and healthy diet. ? ?We will let you know lab results as they come in. ? ?Follow up date appointment will be determined after lab review.   ? ?For fatigue added tsh, t4, b12 and b1 level.  ? ?For bilateral shoulder pain refer to sports med MD. ? ?For desired wt loss recommend diet and exercise. Consider [employer] app. With your BMI appears you don't have much to lose.  ? ?Would recommend you get scheduled with your derm for screening exam as you have various scatterd moles on inspection.  ? ?Preventive Care 4-44 Years Old, Male ?Preventive care refers to lifestyle choices and visits with your health care provider that can promote health and wellness. Preventive care visits are also called wellness exams. ?What can I expect for my preventive care visit? ?Counseling ?During your preventive care visit, your health care provider may ask about your: ?Medical history, including: ?Past medical problems. ?Family medical history. ?Current health, including: ?Emotional well-being. ?Home life and relationship well-being. ?Sexual activity. ?Lifestyle, including: ?Alcohol, nicotine or tobacco, and drug use. ?Access to firearms. ?Diet, exercise, and sleep habits. ?Safety issues such as seatbelt and bike helmet use. ?Sunscreen use. ?Work and work Statistician. ?Physical exam ?Your health care provider will check your: ?Height and weight. These may be used to calculate your BMI (body mass index). BMI is a measurement that tells if you are at a healthy weight. ?Waist circumference. This measures the distance around your waistline. This measurement also tells if you are at a healthy weight and may help predict your risk of certain diseases, such as type 2 diabetes and high blood pressure. ?Heart rate and blood pressure. ?Body temperature. ?Skin for abnormal spots. ?What immunizations do I need? ?Vaccines are usually  given at various ages, according to a schedule. Your health care provider will recommend vaccines for you based on your age, medical history, and lifestyle or other factors, such as travel or where you work. ?What tests do I need? ?Screening ?Your health care provider may recommend screening tests for certain conditions. This may include: ?Lipid and cholesterol levels. ?Diabetes screening. This is done by checking your blood sugar (glucose) after you have not eaten for a while (fasting). ?Hepatitis B test. ?Hepatitis C test. ?HIV (human immunodeficiency virus) test. ?STI (sexually transmitted infection) testing, if you are at risk. ?Lung cancer screening. ?Prostate cancer screening. ?Colorectal cancer screening. ?Talk with your health care provider about your test results, treatment options, and if necessary, the need for more tests. ?Follow these instructions at home: ?Eating and drinking ? ?Eat a diet that includes fresh fruits and vegetables, whole grains, lean protein, and low-fat dairy products. ?Take vitamin and mineral supplements as recommended by your health care provider. ?Do not drink alcohol if your health care provider tells you not to drink. ?If you drink alcohol: ?Limit how much you have to 0-2 drinks a day. ?Know how much alcohol is in your drink. In the [place] one drink equals one 12 oz bottle of beer (355 mL), one 5 oz glass of wine (148 mL), or one 1? oz glass of hard liquor (44 mL). ?Lifestyle ?Brush your teeth every morning and night with fluoride toothpaste. Floss one time each day. ?Exercise for at least 30 minutes 5 or more days each week. ?Do not use any products that contain nicotine or tobacco. These products include cigarettes, chewing tobacco, and vaping devices,  such as e-cigarettes. If you need help quitting, ask your health care provider. ?Do not use drugs. ?If you are sexually active, practice safe sex. Use a condom or other form of protection to prevent STIs. ?Take aspirin only as  told by your health care provider. Make sure that you understand how much to take and what form to take. Work with your health care provider to find out whether it is safe and beneficial for you to take aspirin daily. ?Find healthy ways to manage stress, such as: ?Meditation, yoga, or listening to music. ?Journaling. ?Talking to a trusted person. ?Spending time with friends and family. ?Minimize exposure to UV radiation to reduce your risk of skin cancer. ?Safety ?Always wear your seat belt while driving or riding in a vehicle. ?Do not drive: ?If you have been drinking alcohol. Do not ride with someone who has been drinking. ?When you are tired or distracted. ?While texting. ?If you have been using any mind-altering substances or drugs. ?Wear a helmet and other protective equipment during sports activities. ?If you have firearms in your house, make sure you follow all gun safety procedures. ?What's next? ?Go to your health care provider once a year for an annual wellness visit. ?Ask your health care provider how often you should have your eyes and teeth checked. ?Stay up to date on all vaccines. ?This information is not intended to replace advice given to you by your health care provider. Make sure you discuss any questions you have with your health care provider. ?Document Revised: 11/02/2020 Document Reviewed: 11/02/2020 ?Elsevier Patient Education ? 2022 [place]. ? ? ? ? ? ? ? ? ?

## 2021-07-25 NOTE — Progress Notes (Signed)
Subjective:    Patient ID: Juan Collier, male    DOB: 07-Jan-1971, 51 y.o.   MRN: [id number]  HPI  Pt in wellness exam.   Pt is fasting.   Pt is exercising/jogging regularly. Jogging at most 2 days a week. About half as much as used to.   Pt states his diet is not as good as was in past. Gained 18 lb since last year.  Bilateral shoulder pain. Sometimes pain when wakes up in am. No accident or weight lifting.   Declines flu vaccine. Pt had 2 covid vaccines. Agrees to get shingrix today.   Pt has not had colonoscopy.  Review of Systems  Constitutional:  Positive for fatigue. Negative for chills and fever.  HENT:  Negative for congestion, drooling and ear discharge.   Respiratory:  Negative for cough, chest tightness, shortness of breath and wheezing.   Cardiovascular:  Negative for chest pain and palpitations.  Gastrointestinal:  Negative for abdominal pain, anal bleeding, blood in stool, diarrhea and nausea.  Musculoskeletal:  Negative for back pain, joint swelling and neck pain.  Skin:  Negative for rash.  Neurological:  Negative for dizziness, seizures, light-headedness and numbness.  Hematological:  Negative for adenopathy. Does not bruise/bleed easily.  Psychiatric/Behavioral:  Negative for behavioral problems, confusion, decreased concentration and dysphoric mood. The patient is not nervous/anxious.    Past Medical History:  Diagnosis Date   Allergy    History of fainting spells of unknown cause    History of stomach ulcers    Rheumatic fever    Tuberculosis positve      Social History   Socioeconomic History   Marital status: Married    Spouse name: Not on file   Number of children: Not on file   Years of education: Not on file   Highest education level: Not on file  Occupational History   Not on file  Tobacco Use   Smoking status: Never   Smokeless tobacco: Never  Substance and Sexual Activity   Alcohol use: Yes    Alcohol/week: 2.0 standard drinks     Types: 2 Cans of beer per week   Drug use: No   Sexual activity: Yes    Birth control/protection: None  Other Topics Concern   Not on file  Social History Narrative   Not on file   Social Determinants of Health   Financial Resource Strain: Not on file  Food Insecurity: Not on file  Transportation Needs: Not on file  Physical Activity: Not on file  Stress: Not on file  Social Connections: Not on file  Intimate Partner Violence: Not on file    No past surgical history on file.  Family History  Problem Relation Age of Onset   Thyroid disease Daughter    ADD / ADHD Son    Bipolar disorder Maternal Grandmother     Allergies  Allergen Reactions   Mold Extract [Trichophyton]    Oak Bark [Quercus Robur]    Pollen Extract     Current Outpatient Medications on File Prior to Visit  Medication Sig Dispense Refill   levocetirizine (XYZAL) 5 MG tablet 1 tablet in the evening     No current facility-administered medications on file prior to visit.    BP 125/77    Pulse 75    Temp 98.2 F (36.8 C)    Resp 18    Ht '5\' 8"'$  (1.727 m)    Wt 176 lb 6.4 oz (80 kg)  SpO2 100%    BMI 26.82 kg/m       Objective:   Physical Exam  General Mental Status- Alert. General Appearance- Not in acute distress.   Skin Scattered small freckles and moles.   Neck Carotid Arteries- Normal color. Moisture- Normal Moisture. No carotid bruits. No JVD.  Chest and Lung Exam Auscultation: Breath Sounds:-Normal.  Cardiovascular Auscultation:Rythm- Regular. Murmurs & Other Heart Sounds:Auscultation of the heart reveals- No Murmurs.  Abdomen Inspection:-Inspeection Normal. Palpation/Percussion:Note:No mass. Palpation and Percussion of the abdomen reveal- Non Tender, Non Distended + BS, no rebound or guarding.   Neurologic Cranial Nerve exam:- CN III-XII intact(No nystagmus), symmetric smile. Strength:- 5/5 equal and symmetric strength both upper and lower extremities.       Assessment  & Plan:   Patient Instructions  For you wellness exam today I have ordered cbc, cmp and lipid panel.  Vaccine given today shingrx.  Recommend exercise and healthy diet.  We will let you know lab results as they come in.  Follow up date appointment will be determined after lab review.    For fatigue added tsh, t4, b12 and b1 level.   For bilateral shoulder pain refer to sports med MD.  For desired wt loss recommend diet and exercise. Consider [employer] app. With your BMI appears you don't have much to lose.   Would recommend you get scheduled with your derm for screening exam as you have various scatterd moles on inspection.   Preventive Care 3-58 Years Old, Male Preventive care refers to lifestyle choices and visits with your health care provider that can promote health and wellness. Preventive care visits are also called wellness exams. What can I expect for my preventive care visit? Counseling During your preventive care visit, your health care provider may ask about your: Medical history, including: Past medical problems. Family medical history. Current health, including: Emotional well-being. Home life and relationship well-being. Sexual activity. Lifestyle, including: Alcohol, nicotine or tobacco, and drug use. Access to firearms. Diet, exercise, and sleep habits. Safety issues such as seatbelt and bike helmet use. Sunscreen use. Work and work Statistician. Physical exam Your health care provider will check your: Height and weight. These may be used to calculate your BMI (body mass index). BMI is a measurement that tells if you are at a healthy weight. Waist circumference. This measures the distance around your waistline. This measurement also tells if you are at a healthy weight and may help predict your risk of certain diseases, such as type 2 diabetes and high blood pressure. Heart rate and blood pressure. Body temperature. Skin for abnormal spots. What immunizations  do I need? Vaccines are usually given at various ages, according to a schedule. Your health care provider will recommend vaccines for you based on your age, medical history, and lifestyle or other factors, such as travel or where you work. What tests do I need? Screening Your health care provider may recommend screening tests for certain conditions. This may include: Lipid and cholesterol levels. Diabetes screening. This is done by checking your blood sugar (glucose) after you have not eaten for a while (fasting). Hepatitis B test. Hepatitis C test. HIV (human immunodeficiency virus) test. STI (sexually transmitted infection) testing, if you are at risk. Lung cancer screening. Prostate cancer screening. Colorectal cancer screening. Talk with your health care provider about your test results, treatment options, and if necessary, the need for more tests. Follow these instructions at home: Eating and drinking  Eat a diet that includes fresh fruits  and vegetables, whole grains, lean protein, and low-fat dairy products. Take vitamin and mineral supplements as recommended by your health care provider. Do not drink alcohol if your health care provider tells you not to drink. If you drink alcohol: Limit how much you have to 0-2 drinks a day. Know how much alcohol is in your drink. In the [place] one drink equals one 12 oz bottle of beer (355 mL), one 5 oz glass of wine (148 mL), or one 1 oz glass of hard liquor (44 mL). Lifestyle Brush your teeth every morning and night with fluoride toothpaste. Floss one time each day. Exercise for at least 30 minutes 5 or more days each week. Do not use any products that contain nicotine or tobacco. These products include cigarettes, chewing tobacco, and vaping devices, such as e-cigarettes. If you need help quitting, ask your health care provider. Do not use drugs. If you are sexually active, practice safe sex. Use a condom or other form of protection to  prevent STIs. Take aspirin only as told by your health care provider. Make sure that you understand how much to take and what form to take. Work with your health care provider to find out whether it is safe and beneficial for you to take aspirin daily. Find healthy ways to manage stress, such as: Meditation, yoga, or listening to music. Journaling. Talking to a trusted person. Spending time with friends and family. Minimize exposure to UV radiation to reduce your risk of skin cancer. Safety Always wear your seat belt while driving or riding in a vehicle. Do not drive: If you have been drinking alcohol. Do not ride with someone who has been drinking. When you are tired or distracted. While texting. If you have been using any mind-altering substances or drugs. Wear a helmet and other protective equipment during sports activities. If you have firearms in your house, make sure you follow all gun safety procedures. What's next? Go to your health care provider once a year for an annual wellness visit. Ask your health care provider how often you should have your eyes and teeth checked. Stay up to date on all vaccines. This information is not intended to replace advice given to you by your health care provider. Make sure you discuss any questions you have with your health care provider. Document Revised: 11/02/2020 Document Reviewed: 11/02/2020 Elsevier Patient Education  2022 [place], [place] charge as addressed fatigue, shoulder pain and discussed your desired wt loss.

## 2021-07-28 LAB — VITAMIN B1: Vitamin B1 (Thiamine): 9 nmol/L (ref 8–30)

## 2021-08-01 ENCOUNTER — Ambulatory Visit: Payer: BC Managed Care – PPO | Admitting: Family Medicine

## 2021-08-16 ENCOUNTER — Ambulatory Visit: Payer: BC Managed Care – PPO | Admitting: Physical Therapy

## 2022-04-06 ENCOUNTER — Ambulatory Visit: Payer: BC Managed Care – PPO | Admitting: Medical

## 2022-04-18 ENCOUNTER — Ambulatory Visit: Payer: BC Managed Care – PPO | Admitting: Medical

## 2022-04-20 ENCOUNTER — Encounter: Payer: Self-pay | Admitting: Physician Assistant

## 2022-04-20 ENCOUNTER — Ambulatory Visit: Payer: BC Managed Care – PPO | Admitting: Medical

## 2022-04-20 VITALS — BP 114/80 | HR 86 | Temp 98.0°F | Resp 18 | Ht 68.0 in | Wt 172.0 lb

## 2022-04-20 DIAGNOSIS — F439 Reaction to severe stress, unspecified: Secondary | ICD-10-CM

## 2022-04-20 DIAGNOSIS — K649 Unspecified hemorrhoids: Secondary | ICD-10-CM | POA: Diagnosis not present

## 2022-04-20 DIAGNOSIS — K921 Melena: Secondary | ICD-10-CM | POA: Diagnosis not present

## 2022-04-20 DIAGNOSIS — Z1211 Encounter for screening for malignant neoplasm of colon: Secondary | ICD-10-CM | POA: Diagnosis not present

## 2022-04-20 DIAGNOSIS — E785 Hyperlipidemia, unspecified: Secondary | ICD-10-CM | POA: Diagnosis not present

## 2022-04-20 DIAGNOSIS — Z23 Encounter for immunization: Secondary | ICD-10-CM

## 2022-04-20 LAB — COMPREHENSIVE METABOLIC PANEL
ALT: 24 U/L (ref 0–53)
AST: 20 U/L (ref 0–37)
Albumin: 4.6 g/dL (ref 3.5–5.2)
Alkaline Phosphatase: 46 U/L (ref 39–117)
BUN: 12 mg/dL (ref 6–23)
CO2: 26 mEq/L (ref 19–32)
Calcium: 9.3 mg/dL (ref 8.4–10.5)
Chloride: 103 mEq/L (ref 96–112)
Creatinine, Ser: 0.83 mg/dL (ref 0.40–1.50)
GFR: 100.08 mL/min (ref >60.00)
Glucose, Bld: 103 mg/dL — ABNORMAL HIGH (ref 70–99)
Potassium: 4.3 mEq/L (ref 3.5–5.1)
Sodium: 139 mEq/L (ref 135–145)
Total Bilirubin: 0.4 mg/dL (ref 0.2–1.2)
Total Protein: 7.4 g/dL (ref 6.0–8.3)

## 2022-04-20 LAB — LIPID PANEL
Cholesterol: 194 mg/dL (ref 0–200)
HDL: 60 mg/dL (ref >39.00)
LDL Cholesterol: 122 mg/dL — ABNORMAL HIGH (ref 0–99)
NonHDL: 133.61
Total CHOL/HDL Ratio: 3
Triglycerides: 56 mg/dL (ref 0.0–149.0)
VLDL: 11.2 mg/dL (ref 0.0–40.0)

## 2022-04-20 MED ORDER — TRIAMCINOLONE ACETONIDE 0.1 % EX CREA: 1.0000 | TOPICAL_CREAM | Freq: Two times a day (BID) | CUTANEOUS | 0 refills | Status: DC

## 2022-04-20 MED ORDER — HYDROCORTISONE ACETATE 25 MG RE SUPP: 25.0000 mg | Freq: Two times a day (BID) | RECTAL | 0 refills | Status: DC

## 2022-04-20 NOTE — Addendum Note (Signed)
Addended by: Anabel Halon on: 04/20/2022 08:55 AM   Modules accepted: Orders

## 2022-04-20 NOTE — Patient Instructions (Addendum)
You do have blood in your stool but also left side small hemorrhoid.(Not thrombosed presently).  Placed referral to GI for above and for screening colonoscopy. Please call GI office [phone].  Rx Anusol hc suppository.  If you stress becomes to high/getting anxious let me know and could offer medication if you feel need.  For dry flaky skin behind left ear recommend moisturizing daily and rx triamcinolone.  Follow up date 3-4 weeks if appointment to GI significantly.

## 2022-04-20 NOTE — Progress Notes (Signed)
Subjective:    Patient ID: Juan Collier, male    DOB: [birth year], 51 y.o.   MRN: [id number]  HPI  Her for follow up.   Has high cholesterol. He states low cholesterol diet but not exercising.  Pt states a lot of stress and long hours at work. He states sometimes wakes up in morning thinking about how to resolve problems.  Pt states also has hemorrhoids. He states 2-3 weeks ago had some bright red from rectal area. States small amount. He would notice it after using restroom. Now has burning when has bm but no bleeding.  Pt has been referred for colonoscopy but did not get?      Review of Systems  Constitutional:  Negative for chills, fatigue and fever.  HENT:  Negative for dental problem and drooling.   Respiratory:  Negative for cough, chest tightness, shortness of breath and wheezing.   Cardiovascular:  Negative for chest pain and palpitations.  Gastrointestinal:  Negative for abdominal pain, constipation, rectal pain and vomiting.       See hpi.  Genitourinary:  Negative for dysuria, flank pain, frequency and urgency.  Musculoskeletal:  Negative for back pain, joint swelling, myalgias and neck stiffness.  Skin:  Negative for rash.  Neurological:  Negative for dizziness, weakness, numbness and headaches.  Hematological:  Negative for adenopathy. Does not bruise/bleed easily.  Psychiatric/Behavioral:  Negative for behavioral problems, decreased concentration and dysphoric mood.     Past Medical History:  Diagnosis Date   Allergy    History of fainting spells of unknown cause    History of stomach ulcers    Rheumatic fever    Tuberculosis positve      Social History   Socioeconomic History   Marital status: Married    Spouse name: Not on file   Number of children: Not on file   Years of education: Not on file   Highest education level: Not on file  Occupational History   Not on file  Tobacco Use   Smoking status: Never   Smokeless tobacco: Never  Substance  and Sexual Activity   Alcohol use: Yes    Alcohol/week: 2.0 standard drinks of alcohol    Types: 2 Cans of beer per week   Drug use: No   Sexual activity: Yes    Birth control/protection: None  Other Topics Concern   Not on file  Social History Narrative   Not on file   Social Determinants of Health   Financial Resource Strain: Not on file  Food Insecurity: Not on file  Transportation Needs: Not on file  Physical Activity: Not on file  Stress: Not on file  Social Connections: Not on file  Intimate Partner Violence: Not on file    No past surgical history on file.  Family History  Problem Relation Age of Onset   Thyroid disease Daughter    ADD / ADHD Son    Bipolar disorder Maternal Grandmother     Allergies  Allergen Reactions   Mold Extract [Trichophyton]    Oak Bark [Quercus Robur]    Pollen Extract     Current Outpatient Medications on File Prior to Visit  Medication Sig Dispense Refill   levocetirizine (XYZAL) 5 MG tablet 1 tablet in the evening     No current facility-administered medications on file prior to visit.    BP 114/80   Pulse 86   Temp 98 F (36.7 C)   Resp 18   Ht '5\' 8"'$  (1.727  m)   Wt 172 lb (78 kg)   SpO2 100%   BMI 26.15 kg/m        Objective:   Physical Exam  General Mental Status- Alert. General Appearance- Not in acute distress.   Skin General: Color- Normal Color. Moisture- Normal Moisture.  Neck Carotid Arteries- Normal color. Moisture- Normal Moisture. No carotid bruits. No JVD.  Chest and Lung Exam Auscultation: Breath Sounds:-Normal.  Cardiovascular Auscultation:Rythm- Regular. Murmurs & Other Heart Sounds:Auscultation of the heart reveals- No Murmurs.  Abdomen Inspection:-Inspeection Normal. Palpation/Percussion:Note:No mass. Palpation and Percussion of the abdomen reveal- Non Tender, Non Distended + BS, no rebound or guarding.  Neurologic Cranial Nerve exam:- CN III-XII intact(No nystagmus), symmetric  smile. Strength:- 5/5 equal and symmetric strength both upper and lower extremities.   Rectum- (laying on left sie)left side rectum. About 9 oclock position appears to have hemorrhoid but not thrombosed. Stool card + for blood.  Skin- behind ears. No obvious rash presently.    Assessment & Plan:   Patient Instructions  You do have blood in your stool but also left side small hemorrhoid.(Not thrombosed presently).  Placed referral to GI for above and for screening colonoscopy. Please call GI office [phone].  Rx Anusol hc suppository.  If you stress becomes to high/getting anxious let me know and could offer medication if you feel need.  For dry flaky skin behind left ear recommend moisturizing daily and rx triamcinolone.  Follow up date 3-4 weeks if appointment to GI significantly.   Mackie Pai, PA-C

## 2022-04-20 NOTE — Progress Notes (Deleted)
   Complete physical exam  Patient: Juan Collier   DOB: 06-26-1970   51 y.o. Male  MRN: [id number]  Subjective:    Chief Complaint  Patient presents with   Follow-up    Juan Collier is a 51 y.o. male who presents today for a complete physical exam. He reports consuming a {diet types:17450} diet. {types:19826} He generally feels {DESC; WELL/FAIRLY WELL/POORLY:18703}. He reports sleeping {DESC; WELL/FAIRLY WELL/POORLY:18703}. He {does/does not:200015} have additional problems to discuss today.    Most recent fall risk assessment:    05/31/2014    1:16 PM  [place] in the past year? Yes  Comment tripped while running     Most recent depression screenings:    07/25/2021    8:25 AM 01/24/2017    8:34 AM  PHQ 2/9 Scores  PHQ - 2 Score 0 0    {VISON DENTAL STD PSA (Optional):27386}  {History (Optional):23778}  Patient Care Team: Cayli Escajeda, Iris Pert as PCP - General (Internal Medicine)   Outpatient Medications Prior to Visit  Medication Sig   levocetirizine (XYZAL) 5 MG tablet 1 tablet in the evening   No facility-administered medications prior to visit.    ROS        Objective:     BP 114/80   Pulse 86   Temp 98 F (36.7 C)   Resp 18   Ht '5\' 8"'$  (1.727 m)   Wt 172 lb (78 kg)   SpO2 100%   BMI 26.15 kg/m  {Vitals History (Optional):23777}  Physical Exam   No results found for any visits on 04/20/22. {Show previous labs (optional):23779}    Assessment & Plan:    Routine Health Maintenance and Physical Exam  Immunization History  Administered Date(s) Administered   Influenza,inj,Quad PF,6+ Mos 05/30/2013, 03/12/2019, 06/22/2020   PFIZER(Purple Top)SARS-COV-2 Vaccination 04/05/2020   Tdap 06/22/2020   Zoster Recombinat (Shingrix) 07/25/2021    Health Maintenance  Topic Date Due   Hepatitis C Screening  Never done   COLONOSCOPY (Pts 45-63yr Insurance coverage will need to be confirmed)  Never done   Zoster Vaccines- Shingrix (2 of 2)  09/19/2021   INFLUENZA VACCINE  12/19/2021   COVID-19 Vaccine (2 - 2023-24 season) 01/19/2022   DTaP/Tdap/Td (2 - Td or Tdap) 06/22/2030   HIV Screening  Completed   HPV VACCINES  Aged Out    Discussed health benefits of physical activity, and encouraged him to engage in regular exercise appropriate for his age and condition.  Problem List Items Addressed This Visit   None  No follow-ups on file.     EMackie Pai PA-C

## 2022-04-20 NOTE — Addendum Note (Signed)
Addended by: Jeronimo Greaves on: 04/20/2022 08:59 AM   Modules accepted: Orders

## 2022-04-20 NOTE — Addendum Note (Signed)
Addended by: Anabel Halon on: 04/20/2022 08:54 AM   Modules accepted: Orders

## 2022-05-25 ENCOUNTER — Ambulatory Visit: Payer: BC Managed Care – PPO | Admitting: Physician Assistant

## 2022-05-25 ENCOUNTER — Encounter: Payer: Self-pay | Admitting: Physician Assistant

## 2022-05-25 VITALS — BP 110/62 | HR 75 | Ht 69.0 in | Wt 174.0 lb

## 2022-05-25 DIAGNOSIS — K625 Hemorrhage of anus and rectum: Secondary | ICD-10-CM | POA: Diagnosis not present

## 2022-05-25 MED ORDER — NA SULFATE-K SULFATE-MG SULF 17.5-3.13-1.6 GM/177ML PO SOLN: 1.0000 | Freq: Once | ORAL | 0 refills | Status: AC

## 2022-05-25 NOTE — Progress Notes (Signed)
Subjective:    Patient ID: Juan Collier, male    DOB: 1971-03-17, 52 y.o.   MRN: [id number]  HPI Habib is a pleasant 52 year old male, new to GI today and referred by Mackie Pai PA-C for evaluation of rectal bleeding.  Patient has not had any prior GI evaluation. He has been quite healthy with no known chronic medical problems. He has no family history of GI disease that he is aware of. He had onset of his current symptoms in November 2023 with noting bright red blood per rectum and an occasion with bright red blood dripping in the commode and into his underwear at times without a bowel movement.  This went on for a couple of weeks and then stopped.  Since that time he has not had any issues of blood dripping into the commode or seeing blood in his underwear but has noted blood with bowel movements primarily just on the tissue and bright red.  He has not had any associated anal rectal pain since onset of symptoms and no complaints of abdominal pain.  He has not noted any change in bowel habits, no excessive straining or constipation. He had recent rectal exam per PCP with finding of heme positive stool and what was felt to be an internal hemorrhoid palpable at 9 o'clock position He was given a trial of hemorrhoidal therapy with hydrocodone suppositories which she used for short period of time and then stopped because he has not been having any discomfort.  He has been using a hemorrhoidal wipe on a as needed basis for occasional itching which he believes is from a small external hemorrhoid.  Most recent CBC from March 2023 unremarkable with hemoglobin 15.7.  Review of Systems Pertinent positive and negative review of systems were noted in the above HPI section.  All other review of systems was otherwise negative.   Outpatient Encounter Medications as of 05/25/2022  Medication Sig   hydrocortisone (ANUSOL-HC) 25 MG suppository Place 1 suppository (25 mg total) rectally 2 (two) times daily.    levocetirizine (XYZAL) 5 MG tablet 1 tablet in the evening   Na Sulfate-K Sulfate-Mg Sulf 17.5-3.13-1.6 GM/177ML SOLN Take 1 kit by mouth once for 1 dose.   triamcinolone cream (KENALOG) 0.1 % Apply 1 Application topically 2 (two) times daily.   No facility-administered encounter medications on file as of 05/25/2022.   Allergies  Allergen Reactions   Mold Extract [Trichophyton]    Oak Bark [Quercus Robur]    Pollen Extract    Patient Active Problem List   Diagnosis Date Noted   Left shoulder pain 07/02/2016   WRIST PAIN, LEFT 07/13/2010   BACK PAIN 07/13/2010   Social History   Socioeconomic History   Marital status: Married    Spouse name: Not on file   Number of children: 3   Years of education: Not on file   Highest education level: Not on file  Occupational History   Not on file  Tobacco Use   Smoking status: Never   Smokeless tobacco: Never  Vaping Use   Vaping Use: Never used  Substance and Sexual Activity   Alcohol use: Yes    Alcohol/week: 2.0 standard drinks of alcohol    Types: 2 Cans of beer per week    Comment: occ   Drug use: No   Sexual activity: Yes    Birth control/protection: None  Other Topics Concern   Not on file  Social History Narrative   Not on file  Social Determinants of Health   Financial Resource Strain: Not on file  Food Insecurity: Not on file  Transportation Needs: Not on file  Physical Activity: Not on file  Stress: Not on file  Social Connections: Not on file  Intimate Partner Violence: Not on file    Mr. Moquin family history includes ADD / ADHD in his son; Bipolar disorder in his maternal grandmother; Thyroid disease in his daughter.      Objective:    Vitals:   05/25/22 0926  BP: 110/62  Pulse: 75  SpO2: 96%    Physical Exam Well-developed well-nourished male in no acute distress.  Pleasant height, Weight, 174 BMI 25.7  HEENT; nontraumatic normocephalic, EOMI, PE R LA, sclera anicteric. Oropharynx; not examined  today Neck; supple, no JVD Cardiovascular; regular rate and rhythm with S1-S2, no murmur rub or gallop Pulmonary; Clear bilaterally Abdomen; soft, nontender, nondistended, no palpable mass or hepatosplenomegaly, bowel sounds are active Rectal; not done today, done recently per PCP, documented heme positive with internal hemorrhoid palpated Skin; benign exam, no jaundice rash or appreciable lesions Extremities; no clubbing cyanosis or edema skin warm and dry Neuro/Psych; alert and oriented x4, grossly nonfocal mood and affect appropriate        Assessment & Plan:   #45 52 year old male with onset of hematochezia/small-volume November 2023 initially noting some blood dripping into the commode sometimes just with sitting down, and also had small amounts of blood noted on his underwear for about 2 weeks.  The symptoms resolved and since then has been noticing blood just with bowel movements and just on the tissue,/bright red. No abdominal or perianal/rectal discomfort, no changes in bowel habits.  No straining or constipation. Noted heme positive per PCP and felt to have an internal hemorrhoid palpated.  No prior GI evaluation  Etiology of bleeding is unclear, by history most consistent with internal hemorrhoids, though cannot rule out other colonic pathology/lesion.  #2 general good health  Plan; Patient will be scheduled for colonoscopy with Dr. Rush Landmark. Procedure was discussed in detail with the patient including indications risks and benefits and he is agreeable to proceed. Advised trial of RectiCare complete OTC for external pruritus symptoms. Further recommendations pending results at colonoscopy.  I did briefly discuss in office hemorrhoidal banding as an option if colonoscopy reveals internal hemorrhoids and is otherwise unremarkable.  Bonnee Zertuche Genia Harold PA-C 05/25/2022   Cc: Mackie Pai, PA-C

## 2022-05-25 NOTE — Patient Instructions (Addendum)
You have been scheduled for a colonoscopy. Please follow written instructions given to you at your visit today.  Please pick up your prep supplies at the pharmacy within the next 1-3 days. If you use inhalers (even only as needed), please bring them with you on the day of your procedure.  Today we have given you samples/coupon of Recticare to use as needed for your rectal care.  I appreciate the opportunity to care for you. Amy Esterwood, PA-C

## 2022-05-26 NOTE — Progress Notes (Signed)
Attending Physician's Attestation   I have reviewed the chart.   I agree with the Advanced Practitioner's note, impression, and recommendations with any updates as below.    Veyda Kaufman Mansouraty, MD [facility] Advanced Endoscopy Office # [phone]  

## 2022-05-30 ENCOUNTER — Encounter: Payer: Self-pay | Admitting: Gastroenterology

## 2022-06-06 ENCOUNTER — Ambulatory Visit (AMBULATORY_SURGERY_CENTER): Payer: BC Managed Care – PPO | Admitting: Gastroenterology

## 2022-06-06 ENCOUNTER — Other Ambulatory Visit: Payer: Self-pay | Admitting: Gastroenterology

## 2022-06-06 ENCOUNTER — Encounter: Payer: Self-pay | Admitting: Gastroenterology

## 2022-06-06 VITALS — BP 98/63 | HR 54 | Temp 96.6°F | Resp 19 | Ht 69.0 in | Wt 174.0 lb

## 2022-06-06 DIAGNOSIS — D122 Benign neoplasm of ascending colon: Secondary | ICD-10-CM

## 2022-06-06 DIAGNOSIS — K625 Hemorrhage of anus and rectum: Secondary | ICD-10-CM

## 2022-06-06 DIAGNOSIS — D123 Benign neoplasm of transverse colon: Secondary | ICD-10-CM

## 2022-06-06 DIAGNOSIS — D120 Benign neoplasm of cecum: Secondary | ICD-10-CM | POA: Diagnosis not present

## 2022-06-06 DIAGNOSIS — Z1211 Encounter for screening for malignant neoplasm of colon: Secondary | ICD-10-CM

## 2022-06-06 DIAGNOSIS — D127 Benign neoplasm of rectosigmoid junction: Secondary | ICD-10-CM | POA: Diagnosis not present

## 2022-06-06 MED ORDER — SODIUM CHLORIDE 0.9 % IV SOLN: 500.0000 mL | Freq: Once | INTRAVENOUS | Status: DC

## 2022-06-06 NOTE — Progress Notes (Signed)
GASTROENTEROLOGY PROCEDURE H&P NOTE   Primary Care Physician: Mackie Pai, PA-C  HPI: Juan Collier is a 52 y.o. male who presents for Colonoscopy for screening.  Past Medical History:  Diagnosis Date   Allergy    History of fainting spells of unknown cause    History of stomach ulcers    Rheumatic fever    Tuberculosis positve    No past surgical history on file. Current Outpatient Medications  Medication Sig Dispense Refill   hydrocortisone (ANUSOL-HC) 25 MG suppository Place 1 suppository (25 mg total) rectally 2 (two) times daily. 12 suppository 0   levocetirizine (XYZAL) 5 MG tablet 1 tablet in the evening     triamcinolone cream (KENALOG) 0.1 % Apply 1 Application topically 2 (two) times daily. 30 g 0   Current Facility-Administered Medications  Medication Dose Route Frequency Provider Last Rate Last Admin   0.9 %  sodium chloride infusion  500 mL Intravenous Once Mansouraty, Telford Nab., MD        Current Outpatient Medications:    hydrocortisone (ANUSOL-HC) 25 MG suppository, Place 1 suppository (25 mg total) rectally 2 (two) times daily., Disp: 12 suppository, Rfl: 0   levocetirizine (XYZAL) 5 MG tablet, 1 tablet in the evening, Disp: , Rfl:    triamcinolone cream (KENALOG) 0.1 %, Apply 1 Application topically 2 (two) times daily., Disp: 30 g, Rfl: 0  Current Facility-Administered Medications:    0.9 %  sodium chloride infusion, 500 mL, Intravenous, Once, Mansouraty, Telford Nab., MD Allergies  Allergen Reactions   Mold Extract [Trichophyton]    Oak Bark [Quercus Robur]    Pollen Extract    Family History  Problem Relation Age of Onset   Bipolar disorder Maternal Grandmother    Thyroid disease Daughter    ADD / ADHD Son    Liver disease Neg Hx    Esophageal cancer Neg Hx    Colon cancer Neg Hx    Social History   Socioeconomic History   Marital status: Married    Spouse name: Not on file   Number of children: 3   Years of education: Not on file    Highest education level: Not on file  Occupational History   Not on file  Tobacco Use   Smoking status: Never   Smokeless tobacco: Never  Vaping Use   Vaping Use: Never used  Substance and Sexual Activity   Alcohol use: Yes    Alcohol/week: 2.0 standard drinks of alcohol    Types: 2 Cans of beer per week    Comment: occ   Drug use: No   Sexual activity: Yes    Birth control/protection: None  Other Topics Concern   Not on file  Social History Narrative   Not on file   Social Determinants of Health   Financial Resource Strain: Not on file  Food Insecurity: Not on file  Transportation Needs: Not on file  Physical Activity: Not on file  Stress: Not on file  Social Connections: Not on file  Intimate Partner Violence: Not on file    Physical Exam: There were no vitals filed for this visit. There is no height or weight on file to calculate BMI. GEN: NAD EYE: Sclerae anicteric ENT: MMM CV: Non-tachycardic GI: Soft, NT/ND NEURO:  Alert & Oriented x 3  Lab Results: No results for input(s): "WBC", "HGB", "HCT", "PLT" in the last 72 hours. BMET No results for input(s): "NA", "K", "CL", "CO2", "GLUCOSE", "BUN", "CREATININE", "CALCIUM" in the last 72  hours. LFT No results for input(s): "PROT", "ALBUMIN", "AST", "ALT", "ALKPHOS", "BILITOT", "BILIDIR", "IBILI" in the last 72 hours. PT/INR No results for input(s): "LABPROT", "INR" in the last 72 hours.   Impression / Plan: This is a 52 y.o.male  who presents for Colonoscopy for screening.  The risks and benefits of endoscopic evaluation/treatment were discussed with the patient and/or family; these include but are not limited to the risk of perforation, infection, bleeding, missed lesions, lack of diagnosis, severe illness requiring hospitalization, as well as anesthesia and sedation related illnesses.  The patient's history has been reviewed, patient examined, no change in status, and deemed stable for procedure.  The  patient and/or family is agreeable to proceed.    Justice Britain, MD [facility] Advanced Endoscopy Office # [phone]

## 2022-06-06 NOTE — Progress Notes (Signed)
Called to room to assist during endoscopic procedure.  Patient ID and intended procedure confirmed with present staff. Received instructions for my participation in the procedure from the performing physician.  

## 2022-06-06 NOTE — Op Note (Signed)
[facility] Patient Name: Juan Collier Procedure Date: 06/06/2022 1:55 PM MRN: [id number] Endoscopist: Justice Britain , MD, [id number] Age: 52 Referring MD:  Date of Birth: Jan 18, 1971 Gender: Male Account #: [id number] Procedure:                Colonoscopy Indications:              Screening for colorectal malignant neoplasm Medicines:                Monitored Anesthesia Care Procedure:                Pre-Anesthesia Assessment:                           - Prior to the procedure, a History and Physical                            was performed, and patient medications and                            allergies were reviewed. The patient's tolerance of                            previous anesthesia was also reviewed. The risks                            and benefits of the procedure and the sedation                            options and risks were discussed with the patient.                            All questions were answered, and informed consent                            was obtained. Prior Anticoagulants: The patient has                            taken no anticoagulant or antiplatelet agents. ASA                            Grade Assessment: II - A patient with mild systemic                            disease. After reviewing the risks and benefits,                            the patient was deemed in satisfactory condition to                            undergo the procedure.                           After obtaining informed consent, the colonoscope  was passed under direct vision. Throughout the                            procedure, the patient's blood pressure, pulse, and                            oxygen saturations were monitored continuously. The                            CF [id number] #[id number] was introduced through the anus                            and advanced to the 3 cm into the ileum. The                            colonoscopy was  performed without difficulty. The                            patient tolerated the procedure. The quality of the                            bowel preparation was good. The terminal ileum,                            ileocecal valve, appendiceal orifice, and rectum                            were photographed. Scope In: 2:03:16 PM Scope Out: 2:20:31 PM Scope Withdrawal Time: 0 hours 14 minutes 21 seconds  Total Procedure Duration: 0 hours 17 minutes 15 seconds  Findings:                 The digital rectal exam findings include                            hemorrhoids. Pertinent negatives include no                            palpable rectal lesions.                           The terminal ileum and ileocecal valve appeared                            normal.                           Five sessile polyps were found in the recto-sigmoid                            colon, transverse colon, hepatic flexure, ascending                            colon and cecum. The polyps were 3 to 6 mm in size.  These polyps were removed with a cold snare.                            Resection and retrieval were complete.                           Normal mucosa was found in the entire colon                            otherwise.                           Non-bleeding non-thrombosed external and internal                            hemorrhoids were found during retroflexion, during                            perianal exam and during digital exam. The                            hemorrhoids were Grade III (internal hemorrhoids                            that prolapse but require manual reduction). Complications:            No immediate complications. Estimated Blood Loss:     Estimated blood loss was minimal. Impression:               - Hemorrhoids found on digital rectal exam.                           - The examined portion of the ileum was normal.                           - Five 3 to 6 mm  polyps at the recto-sigmoid colon,                            in the transverse colon, at the hepatic flexure, in                            the ascending colon and in the cecum, removed with                            a cold snare. Resected and retrieved.                           - Normal mucosa in the entire examined colon.                           - Non-bleeding non-thrombosed external and internal                            hemorrhoids. Recommendation:           - The  patient will be observed post-procedure,                            until all discharge criteria are met.                           - Discharge patient to home.                           - Patient has a contact number available for                            emergencies. The signs and symptoms of potential                            delayed complications were discussed with the                            patient. Return to normal activities tomorrow.                            Written discharge instructions were provided to the                            patient.                           - High fiber diet.                           - Use FiberCon 1-2 tablets PO daily.                           - Resume previous diet.                           - Continue present medications.                           - Await pathology results.                           - Repeat colonoscopy in 3 years for surveillance.                           - Use Calmol-4 suppositories or can prescribe                            Anusol suppositories (either could be used nightly                            x 1 week and then every other day).                           - If issues persist in regards to recurrent rectal  bleeding, can refer to one of my partners to try                            and perform internal hemorrhoidal banding to try                            and help, through patient does have some external                             hemorrhoidal disease as well to keep in mind.                           - The findings and recommendations were discussed                            with the patient.                           - The findings and recommendations were discussed                            with the patient's family. Justice Britain, MD 06/06/2022 2:29:42 PM

## 2022-06-06 NOTE — Patient Instructions (Signed)
Handouts provided on polyps, hemorrhoids, hemorrhoid banding and high fiber diet.   Recommend a high-fiber diet (see handout). Use FiberCon 1-2 tablets by mouth daily.  Continue present medications.  Await pathology results.  Repeat colonoscopy in 3 years for surveillance.  Use Calmol-4 suppositories or can prescribe Anusol suppositories (either could be used nightly x1 week and then every other day).  If issues persist in regards to recurrent rectal bleeding, can refer to one of my partners to try and perform internal hemorrhoidal banding to try and help.   YOU HAD AN ENDOSCOPIC PROCEDURE TODAY AT [facility]:   Refer to the procedure report that was given to you for any specific questions about what was found during the examination.  If the procedure report does not answer your questions, please call your gastroenterologist to clarify.  If you requested that your care partner not be given the details of your procedure findings, then the procedure report has been included in a sealed envelope for you to review at your convenience later.  YOU SHOULD EXPECT: Some feelings of bloating in the abdomen. Passage of more gas than usual.  Walking can help get rid of the air that was put into your GI tract during the procedure and reduce the bloating. If you had a lower endoscopy (such as a colonoscopy or flexible sigmoidoscopy) you may notice spotting of blood in your stool or on the toilet paper. If you underwent a bowel prep for your procedure, you may not have a normal bowel movement for a few days.  Please Note:  You might notice some irritation and congestion in your nose or some drainage.  This is from the oxygen used during your procedure.  There is no need for concern and it should clear up in a day or so.  SYMPTOMS TO REPORT IMMEDIATELY:  Following lower endoscopy (colonoscopy or flexible sigmoidoscopy):  Excessive amounts of blood in the stool  Significant tenderness or  worsening of abdominal pains  Swelling of the abdomen that is new, acute  Fever of 100F or higher  For urgent or emergent issues, a gastroenterologist can be reached at any hour by calling [phone]. Do not use MyChart messaging for urgent concerns.    DIET:  We do recommend a small meal at first, but then you may proceed to your regular diet.  Drink plenty of fluids but you should avoid alcoholic beverages for 24 hours.  ACTIVITY:  You should plan to take it easy for the rest of today and you should NOT DRIVE or use heavy machinery until tomorrow (because of the sedation medicines used during the test).    FOLLOW UP: Our staff will call the number listed on your records the next business day following your procedure.  We will call around 7:15- 8:00 am to check on you and address any questions or concerns that you may have regarding the information given to you following your procedure. If we do not reach you, we will leave a message.     If any biopsies were taken you will be contacted by phone or by letter within the next 1-3 weeks.  Please call us at [phone] if you have not heard about the biopsies in 3 weeks.    SIGNATURES/CONFIDENTIALITY: You and/or your care partner have signed paperwork which will be entered into your electronic medical record.  These signatures attest to the fact that that the information above on your After Visit Summary has been reviewed and  is understood.  Full responsibility of the confidentiality of this discharge information lies with you and/or your care-partner.

## 2022-06-06 NOTE — Progress Notes (Signed)
Pt's states no medical or surgical changes since previsit or office visit. 

## 2022-06-07 ENCOUNTER — Telehealth: Payer: Self-pay

## 2022-06-07 NOTE — Telephone Encounter (Signed)
  Follow up Call-     06/06/2022    1:26 PM  Call back number  Post procedure Call Back phone  # [phone]  Permission to leave phone message Yes    Follow up call made.  NALM

## 2022-06-15 ENCOUNTER — Encounter: Payer: Self-pay | Admitting: Gastroenterology

## 2022-08-08 ENCOUNTER — Ambulatory Visit: Payer: BC Managed Care – PPO | Admitting: Medical

## 2022-08-08 VITALS — BP 112/76 | HR 72 | Resp 18 | Ht 69.0 in | Wt 173.0 lb

## 2022-08-08 DIAGNOSIS — R5383 Other fatigue: Secondary | ICD-10-CM | POA: Diagnosis not present

## 2022-08-08 DIAGNOSIS — R059 Cough, unspecified: Secondary | ICD-10-CM

## 2022-08-08 DIAGNOSIS — E785 Hyperlipidemia, unspecified: Secondary | ICD-10-CM

## 2022-08-08 DIAGNOSIS — R0683 Snoring: Secondary | ICD-10-CM | POA: Diagnosis not present

## 2022-08-08 DIAGNOSIS — R6882 Decreased libido: Secondary | ICD-10-CM | POA: Diagnosis not present

## 2022-08-08 DIAGNOSIS — R7989 Other specified abnormal findings of blood chemistry: Secondary | ICD-10-CM

## 2022-08-08 DIAGNOSIS — R790 Abnormal level of blood mineral: Secondary | ICD-10-CM

## 2022-08-08 NOTE — Progress Notes (Signed)
Subjective:    Patient ID: BAM MCCREA, male    DOB: 22-Oct-1970, 52 y.o.   MRN: [id number]  HPI Pt in for fatigue. Pt states he feels exhausted. He states some anxiety. He thinks memory is some effected at times. Pt states he walks on treadmill and states at 4000 steps he feels like can't continue. Pt does admit he snores. He thinks this is not new.   He describes that even if he gets more than 8 hours of sleep will feel tired.  He update me that he had colonoscopy. Reviewed report. States repeat colonoscopy in 3 years.    He states he is getting anxious intermittently. He describes not having panic attacks.  Pt Gad 7 score 9.  Phq-9 10. Pt states mood issues have been going on for about same time as the fatigue.      Review of Systems  Constitutional:  Positive for fatigue. Negative for chills and fever.  Respiratory:  Positive for cough. Negative for chest tightness and wheezing.        Pt dose report random cough intemrittently since December. After covid. He states not allergies since onset was before spring.  Cardiovascular:  Negative for chest pain and palpitations.  Genitourinary:  Negative for dysuria.       Some decreased libido.  Musculoskeletal:  Negative for back pain and gait problem.    Past Medical History:  Diagnosis Date   Allergy    History of fainting spells of unknown cause    History of stomach ulcers    Rheumatic fever    Tuberculosis positve      Social History   Socioeconomic History   Marital status: Married    Spouse name: Not on file   Number of children: 3   Years of education: Not on file   Highest education level: Not on file  Occupational History   Not on file  Tobacco Use   Smoking status: Never   Smokeless tobacco: Never  Vaping Use   Vaping Use: Never used  Substance and Sexual Activity   Alcohol use: Yes    Alcohol/week: 2.0 standard drinks of alcohol    Types: 2 Cans of beer per week    Comment: occ   Drug use: No    Sexual activity: Yes    Birth control/protection: None  Other Topics Concern   Not on file  Social History Narrative   Not on file   Social Determinants of Health   Financial Resource Strain: Not on file  Food Insecurity: Not on file  Transportation Needs: Not on file  Physical Activity: Not on file  Stress: Not on file  Social Connections: Not on file  Intimate Partner Violence: Not on file    No past surgical history on file.  Family History  Problem Relation Age of Onset   Bipolar disorder Maternal Grandmother    Thyroid disease Daughter    ADD / ADHD Son    Liver disease Neg Hx    Esophageal cancer Neg Hx    Colon cancer Neg Hx     Allergies  Allergen Reactions   Mold Extract [Trichophyton]    Oak Bark [Quercus Robur]    Pollen Extract     Current Outpatient Medications on File Prior to Visit  Medication Sig Dispense Refill   levocetirizine (XYZAL) 5 MG tablet 1 tablet in the evening     triamcinolone cream (KENALOG) 0.1 % Apply 1 Application topically 2 (two) times daily.  30 g 0   No current facility-administered medications on file prior to visit.    BP 112/76   Pulse 72   Resp 18   Ht 5\' 9"  (1.753 m)   Wt 173 lb (78.5 kg)   SpO2 99%   BMI 25.55 kg/m        Objective:   Physical Exam  General Mental Status- Alert. General Appearance- Not in acute distress.   Skin General: Color- Normal Color. Moisture- Normal Moisture.  Neck Carotid Arteries- Normal color. Moisture- Normal Moisture. No carotid bruits. No JVD.  Chest and Lung Exam Auscultation: Breath Sounds:-Normal.  Cardiovascular Auscultation:Rythm- Regular. Murmurs & Other Heart Sounds:Auscultation of the heart reveals- No Murmurs.  Abdomen Inspection:-Inspeection Normal. Palpation/Percussion:Note:No mass. Palpation and Percussion of the abdomen reveal- Non Tender, Non Distended + BS, no rebound or guarding.   Neurologic Cranial Nerve exam:- CN III-XII intact(No nystagmus),  symmetric smile. Strength:- 5/5 equal and symmetric strength both upper and lower extremities.       Assessment & Plan:   Patient Instructions   Snores If you have sleep apnea this could be a factor in your fatigue. - Ambulatory referral to Pulmonology  Low libido This too could be a factor. Get schedule early morning. - Testosterone Total,Free,Bio, Males-([facility]); Future   Fatigue, unspecified type Will repeat labs below - Testosterone Total,Free,Bio, Males-([facility]); Future - CBC w/Diff; Future - Comp Met (CMET); Future - TSH; Future - T4, free; Future - Iron; Future - B12; Future - Vitamin B1; Future   Some anxiety and depression by review of phq-9 score and gad-7 score. If above workup negative would recommend trial of effexor.  For cough more than 3 months getting chest xray.   Follow up date to be determined after lab review.    Mackie Pai, PA-C

## 2022-08-08 NOTE — Patient Instructions (Addendum)
Snores If you have sleep apnea this could be a factor in your fatigue. - Ambulatory referral to Pulmonology  Low libido This too could be a factor. Get schedule early morning. - Testosterone Total,Free,Bio, Males-([facility]); Future   Fatigue, unspecified type Will repeat labs below - Testosterone Total,Free,Bio, Males-([facility]); Future - CBC w/Diff; Future - Comp Met (CMET); Future - TSH; Future - T4, free; Future - Iron; Future - B12; Future - Vitamin B1; Future   Some anxiety and depression by review of phq-9 score and gad-7 score. If above workup negative would recommend trial of effexor.  For cough more than 3 months getting chest xray.   Follow up date to be determined after lab review.

## 2022-08-09 ENCOUNTER — Other Ambulatory Visit (INDEPENDENT_AMBULATORY_CARE_PROVIDER_SITE_OTHER): Payer: BC Managed Care – PPO

## 2022-08-09 ENCOUNTER — Encounter: Payer: Self-pay | Admitting: Medical

## 2022-08-09 DIAGNOSIS — R6882 Decreased libido: Secondary | ICD-10-CM

## 2022-08-09 DIAGNOSIS — R5383 Other fatigue: Secondary | ICD-10-CM | POA: Diagnosis not present

## 2022-08-09 DIAGNOSIS — E785 Hyperlipidemia, unspecified: Secondary | ICD-10-CM | POA: Diagnosis not present

## 2022-08-09 LAB — CBC WITH DIFFERENTIAL/PLATELET
Basophils Absolute: 0 10*3/uL (ref 0.0–0.1)
Basophils Relative: 0.6 % (ref 0.0–3.0)
Eosinophils Absolute: 0.2 10*3/uL (ref 0.0–0.7)
Eosinophils Relative: 3.2 % (ref 0.0–5.0)
HCT: 48.4 % (ref 39.0–52.0)
Hemoglobin: 16.4 g/dL (ref 13.0–17.0)
Lymphocytes Relative: 33.9 % (ref 12.0–46.0)
Lymphs Abs: 2.2 10*3/uL (ref 0.7–4.0)
MCHC: 33.9 g/dL (ref 30.0–36.0)
MCV: 92.1 fl (ref 78.0–100.0)
Monocytes Absolute: 0.5 10*3/uL (ref 0.1–1.0)
Monocytes Relative: 7.6 % (ref 3.0–12.0)
Neutro Abs: 3.6 10*3/uL (ref 1.4–7.7)
Neutrophils Relative %: 54.7 % (ref 43.0–77.0)
Platelets: 301 10*3/uL (ref 150.0–400.0)
RBC: 5.26 Mil/uL (ref 4.22–5.81)
RDW: 13.9 % (ref 11.5–15.5)
WBC: 6.6 10*3/uL (ref 4.0–10.5)

## 2022-08-09 LAB — LIPID PANEL
Cholesterol: 225 mg/dL — ABNORMAL HIGH (ref 0–200)
HDL: 55.6 mg/dL (ref >39.00)
NonHDL: 169.47
Total CHOL/HDL Ratio: 4
Triglycerides: 208 mg/dL — ABNORMAL HIGH (ref 0.0–149.0)
VLDL: 41.6 mg/dL — ABNORMAL HIGH (ref 0.0–40.0)

## 2022-08-09 LAB — COMPREHENSIVE METABOLIC PANEL
ALT: 22 U/L (ref 0–53)
AST: 17 U/L (ref 0–37)
Albumin: 4.6 g/dL (ref 3.5–5.2)
Alkaline Phosphatase: 53 U/L (ref 39–117)
BUN: 15 mg/dL (ref 6–23)
CO2: 28 mEq/L (ref 19–32)
Calcium: 9.9 mg/dL (ref 8.4–10.5)
Chloride: 102 mEq/L (ref 96–112)
Creatinine, Ser: 0.96 mg/dL (ref 0.40–1.50)
GFR: 90.2 mL/min (ref >60.00)
Glucose, Bld: 91 mg/dL (ref 70–99)
Potassium: 4.3 mEq/L (ref 3.5–5.1)
Sodium: 138 mEq/L (ref 135–145)
Total Bilirubin: 0.8 mg/dL (ref 0.2–1.2)
Total Protein: 7.8 g/dL (ref 6.0–8.3)

## 2022-08-09 LAB — LDL CHOLESTEROL, DIRECT: Direct LDL: 133 mg/dL

## 2022-08-09 LAB — TSH: TSH: 3.01 u[IU]/mL (ref 0.35–5.50)

## 2022-08-09 LAB — VITAMIN B12: Vitamin B-12: 431 pg/mL (ref 211–911)

## 2022-08-09 LAB — IRON: Iron: 187 ug/dL — ABNORMAL HIGH (ref 42–165)

## 2022-08-09 LAB — T4, FREE: Free T4: 0.84 ng/dL (ref 0.60–1.60)

## 2022-08-10 LAB — TESTOSTERONE TOTAL,FREE,BIO, MALES
Albumin: 4.5 g/dL (ref 3.6–5.1)
Sex Hormone Binding: 27 nmol/L (ref 10–50)
Testosterone: 199 ng/dL — ABNORMAL LOW (ref 250–827)

## 2022-08-10 NOTE — Addendum Note (Signed)
Addended by: Anabel Halon on: 08/10/2022 12:43 PM   Modules accepted: Orders

## 2022-08-10 NOTE — Addendum Note (Signed)
Addended by: Anabel Halon on: 08/10/2022 01:49 PM   Modules accepted: Orders

## 2022-08-13 LAB — VITAMIN B1: Vitamin B1 (Thiamine): 10 nmol/L (ref 8–30)

## 2022-08-21 ENCOUNTER — Other Ambulatory Visit: Payer: Self-pay | Admitting: Family

## 2022-08-21 DIAGNOSIS — R790 Abnormal level of blood mineral: Secondary | ICD-10-CM

## 2022-08-22 ENCOUNTER — Inpatient Hospital Stay: Payer: BC Managed Care – PPO | Attending: Hematology & Oncology

## 2022-08-22 ENCOUNTER — Inpatient Hospital Stay: Payer: BC Managed Care – PPO | Admitting: Family

## 2022-08-22 VITALS — BP 114/86 | HR 80 | Resp 16 | Ht 69.0 in | Wt 173.0 lb

## 2022-08-22 DIAGNOSIS — K648 Other hemorrhoids: Secondary | ICD-10-CM

## 2022-08-22 DIAGNOSIS — Z8349 Family history of other endocrine, nutritional and metabolic diseases: Secondary | ICD-10-CM | POA: Insufficient documentation

## 2022-08-22 DIAGNOSIS — R790 Abnormal level of blood mineral: Secondary | ICD-10-CM | POA: Insufficient documentation

## 2022-08-22 DIAGNOSIS — Z79899 Other long term (current) drug therapy: Secondary | ICD-10-CM | POA: Diagnosis not present

## 2022-08-22 DIAGNOSIS — Z818 Family history of other mental and behavioral disorders: Secondary | ICD-10-CM | POA: Diagnosis not present

## 2022-08-22 DIAGNOSIS — K644 Residual hemorrhoidal skin tags: Secondary | ICD-10-CM | POA: Diagnosis not present

## 2022-08-22 DIAGNOSIS — R5383 Other fatigue: Secondary | ICD-10-CM | POA: Insufficient documentation

## 2022-08-22 DIAGNOSIS — Z8379 Family history of other diseases of the digestive system: Secondary | ICD-10-CM | POA: Insufficient documentation

## 2022-08-22 LAB — CBC WITH DIFFERENTIAL (CANCER CENTER ONLY)
Abs Immature Granulocytes: 0.03 10*3/uL (ref 0.00–0.07)
Basophils Absolute: 0.1 10*3/uL (ref 0.0–0.1)
Basophils Relative: 1 %
Eosinophils Absolute: 0.2 10*3/uL (ref 0.0–0.5)
Eosinophils Relative: 3 %
HCT: 49.3 % (ref 39.0–52.0)
Hemoglobin: 16.8 g/dL (ref 13.0–17.0)
Immature Granulocytes: 1 %
Lymphocytes Relative: 26 %
Lymphs Abs: 1.5 10*3/uL (ref 0.7–4.0)
MCH: 30.7 pg (ref 26.0–34.0)
MCHC: 34.1 g/dL (ref 30.0–36.0)
MCV: 90 fL (ref 80.0–100.0)
Monocytes Absolute: 0.3 10*3/uL (ref 0.1–1.0)
Monocytes Relative: 5 %
Neutro Abs: 3.8 10*3/uL (ref 1.7–7.7)
Neutrophils Relative %: 64 %
Platelet Count: 295 10*3/uL (ref 150–400)
RBC: 5.48 MIL/uL (ref 4.22–5.81)
RDW: 13 % (ref 11.5–15.5)
WBC Count: 5.8 10*3/uL (ref 4.0–10.5)
nRBC: 0.3 % — ABNORMAL HIGH (ref 0.0–0.2)

## 2022-08-22 LAB — CMP (CANCER CENTER ONLY)
ALT: 38 U/L (ref 0–44)
AST: 28 U/L (ref 15–41)
Albumin: 5.1 g/dL — ABNORMAL HIGH (ref 3.5–5.0)
Alkaline Phosphatase: 48 U/L (ref 38–126)
Anion gap: 9 (ref 5–15)
BUN: 11 mg/dL (ref 6–20)
CO2: 27 mmol/L (ref 22–32)
Calcium: 10.3 mg/dL (ref 8.9–10.3)
Chloride: 102 mmol/L (ref 98–111)
Creatinine: 0.95 mg/dL (ref 0.61–1.24)
GFR, Estimated: >60 mL/min (ref >60)
Glucose, Bld: 113 mg/dL — ABNORMAL HIGH (ref 70–99)
Potassium: 4.1 mmol/L (ref 3.5–5.1)
Sodium: 138 mmol/L (ref 135–145)
Total Bilirubin: 0.4 mg/dL (ref 0.3–1.2)
Total Protein: 8.5 g/dL — ABNORMAL HIGH (ref 6.5–8.1)

## 2022-08-22 LAB — RETICULOCYTES
Immature Retic Fract: 6.8 % (ref 2.3–15.9)
RBC.: 5.42 MIL/uL (ref 4.22–5.81)
Retic Count, Absolute: 82.4 10*3/uL (ref 19.0–186.0)
Retic Ct Pct: 1.5 % (ref 0.4–3.1)

## 2022-08-22 LAB — FERRITIN: Ferritin: 121 ng/mL (ref 24–336)

## 2022-08-22 LAB — LACTATE DEHYDROGENASE: LDH: 118 U/L (ref 98–192)

## 2022-08-22 NOTE — Progress Notes (Signed)
Hematology/Oncology Consultation   Name: Juan Collier      MRN: [id number]    Location: Room/bed info not found  Date: 08/22/2022 Time:11:09 AM   REFERRING PHYSICIAN: Mackie Pai, PA-C  REASON FOR CONSULT: Abnormal serum iron level, fatigue   DIAGNOSIS: Work-up pending for Iron deficiency vs. Hemochromatosis   HISTORY OF PRESENT ILLNESS:  Juan Collier is a pleasant 52 yo gentleman with recently noted elevated total iron count of 187.  He has noted fatigue recently that has effected his ability to be active and also his recall at work.  His testosterone level was low at 199 and he will discuss with PCP at follow-up.  Recent TSH was normal at 3.01.  No history of diabetes or thyroid disease.  No known liver or spleen disease.  No personal or immediate familial history of cancer. His grandparents were heavy smokers and had esophageal cancer.  He had his colonoscopy earlier this year in January. He had 5 benign polyps removed and was noted to have internal and external hemorrhoids.  He has not noted any blood loss. No bruising or petechiae.  He states that [place] and was told he likely had TB at some point.  He had rheumatic fever at 52 yo and did have a murmur as well as issues with the cartilage in his left knee. The murmur is not present on today's exam. His knee healed over the course of 5 years.  He is typically a runner but has had to take a break due to the fatigue.  No fever, chills, n/v, cough, rash, dizziness, SOB, chest pain, palpitations, abdominal pain or changes in bowel or bladder habits.  No swelling, tenderness, numbness or tingling in his extremities.  No falls or syncope.  Appetite and hydration are good. Weight is stable at 173 lbs.  No smoking or recreational drug use. ETOH 2-3 beers a week.  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   ROS: All other 10 point review of systems is negative.   PAST MEDICAL HISTORY:    Past Medical History:  Diagnosis Date   Allergy    History of fainting spells of unknown cause    History of stomach ulcers    Rheumatic fever    Tuberculosis positve     ALLERGIES: Allergies  Allergen Reactions   Mold Extract [Trichophyton]    Oak Bark [Quercus Robur]    Pollen Extract       MEDICATIONS:  Current Outpatient Medications on File Prior to Visit  Medication Sig Dispense Refill   azelastine (OPTIVAR) 0.05 % ophthalmic solution Apply 1 drop to eye 2 (two) times daily.     levocetirizine (XYZAL) 5 MG tablet 1 tablet in the evening     triamcinolone cream (KENALOG) 0.1 % Apply 1 Application topically 2 (two) times daily. 30 g 0   EPINEPHrine 0.3 mg/0.3 mL IJ SOAJ injection Inject into the muscle. (Patient not taking: Reported on 08/22/2022)     No current facility-administered medications on file prior to visit.     PAST SURGICAL HISTORY No past surgical history on file.  FAMILY HISTORY: Family History  Problem Relation Age of Onset   Bipolar disorder Maternal Grandmother    Thyroid disease Daughter    ADD / ADHD Son    Liver disease Neg Hx    Esophageal cancer Neg Hx    Colon cancer Neg Hx     SOCIAL  HISTORY:  reports that he has never smoked. He has never used smokeless tobacco. He reports current alcohol use of about 2.0 standard drinks of alcohol per week. He reports that he does not use drugs.  PERFORMANCE STATUS: The patient's performance status is 1 - Symptomatic but completely ambulatory  PHYSICAL EXAM: Most Recent Vital Signs: Blood pressure 114/86, pulse 80, resp. rate 16, height 5\' 9"  (1.753 m), weight 173 lb (78.5 kg), SpO2 99 %. BP 114/86 (BP Location: Left Arm, Patient Position: Sitting)   Pulse 80   Resp 16   Ht 5\' 9"  (1.753 m)   Wt 173 lb (78.5 kg)   SpO2 99%   BMI 25.55 kg/m   General Appearance:    Alert, cooperative, no distress, appears stated age  Head:    Normocephalic, without obvious abnormality, atraumatic  Eyes:     PERRL, conjunctiva/corneas clear, EOM's intact, fundi    benign, both eyes             Throat:   Lips, mucosa, and tongue normal; teeth and gums normal  Neck:   Supple, symmetrical, trachea midline, no adenopathy;       thyroid:  No enlargement/tenderness/nodules; no carotid   bruit or JVD  Back:     Symmetric, no curvature, ROM normal, no CVA tenderness  Lungs:     Clear to auscultation bilaterally, respirations unlabored  Chest wall:    No tenderness or deformity  Heart:    Regular rate and rhythm, S1 and S2 normal, no murmur, rub   or gallop  Abdomen:     Soft, non-tender, bowel sounds active all four quadrants,    no masses, no organomegaly        Extremities:   Extremities normal, atraumatic, no cyanosis or edema  Pulses:   2+ and symmetric all extremities  Skin:   Skin color, texture, turgor normal, no rashes or lesions  Lymph nodes:   Cervical, supraclavicular, and axillary nodes normal  Neurologic:   CNII-XII intact. Normal strength, sensation and reflexes      throughout    LABORATORY DATA:  Results for orders placed or performed in visit on 08/22/22 (from the past 48 hour(s))  CBC with Differential ([facility] Only)     Status: Abnormal   Collection Time: 08/22/22 10:48 AM  Result Value Ref Range   WBC Count 5.8 4.0 - 10.5 K/uL   RBC 5.48 4.22 - 5.81 MIL/uL   Hemoglobin 16.8 13.0 - 17.0 g/dL   HCT 49.3 39.0 - 52.0 %   MCV 90.0 80.0 - 100.0 fL   MCH 30.7 26.0 - 34.0 pg   MCHC 34.1 30.0 - 36.0 g/dL   RDW 13.0 11.5 - 15.5 %   Platelet Count 295 150 - 400 K/uL   nRBC 0.3 (H) 0.0 - 0.2 %   Neutrophils Relative % 64 %   Neutro Abs 3.8 1.7 - 7.7 K/uL   Lymphocytes Relative 26 %   Lymphs Abs 1.5 0.7 - 4.0 K/uL   Monocytes Relative 5 %   Monocytes Absolute 0.3 0.1 - 1.0 K/uL   Eosinophils Relative 3 %   Eosinophils Absolute 0.2 0.0 - 0.5 K/uL   Basophils Relative 1 %   Basophils Absolute 0.1 0.0 - 0.1 K/uL   Immature Granulocytes 1 %   Abs Immature Granulocytes  0.03 0.00 - 0.07 K/uL    Comment: Performed at [facility] at [facility], [address], [place], [place] [address]  Reticulocytes     Status: None   Collection Time: 08/22/22 10:49 AM  Result Value Ref Range   Retic Ct Pct 1.5 0.4 - 3.1 %   RBC. 5.42 4.22 - 5.81 MIL/uL   Retic Count, Absolute 82.4 19.0 - 186.0 K/uL   Immature Retic Fract 6.8 2.3 - 15.9 %    Comment: Performed at [facility] at [facility], [address], [place], [place] [address]      RADIOGRAPHY: No results found.     PATHOLOGY: None   ASSESSMENT/PLAN: Mr. Tsung is a pleasant 52 yo gentleman with recently noted elevated total iron count of 187.  Iron studies and hemochromatosis DNA are pending.  We will determine treatment plan and follow-up if needed once results are available.   All questions were answered. The patient knows to call the clinic with any problems, questions or concerns. We can certainly see the patient much sooner if necessary.  The patient was discussed with Dr. Marin Olp and he is in agreement with the aforementioned.   Lottie Dawson, NP

## 2022-08-23 LAB — IRON AND IRON BINDING CAPACITY (CC-WL,HP ONLY)
Iron: 84 ug/dL (ref 45–182)
Saturation Ratios: 23 % (ref 17.9–39.5)
TIBC: 361 ug/dL (ref 250–450)
UIBC: 277 ug/dL (ref 117–376)

## 2022-08-24 ENCOUNTER — Ambulatory Visit: Payer: BC Managed Care – PPO | Admitting: Medical

## 2022-08-24 VITALS — BP 112/88 | HR 86 | Temp 98.0°F | Ht 69.0 in | Wt 172.0 lb

## 2022-08-24 DIAGNOSIS — M5440 Lumbago with sciatica, unspecified side: Secondary | ICD-10-CM

## 2022-08-24 DIAGNOSIS — R790 Abnormal level of blood mineral: Secondary | ICD-10-CM

## 2022-08-24 MED ORDER — CYCLOBENZAPRINE HCL 10 MG PO TABS: 10.0000 mg | ORAL_TABLET | Freq: Every day | ORAL | 0 refills | Status: DC

## 2022-08-24 MED ORDER — METHYLPREDNISOLONE 4 MG PO TABS: ORAL_TABLET | ORAL | 0 refills | Status: DC

## 2022-08-24 NOTE — Patient Instructions (Addendum)
Low back pain with some features of sciatica.    Will prescribe medrol 4 mg 6 day taper, flexeril 10 mg muscle relaxant to use at night and advise stretching exercises as tolerated.  If symptoms persist can refer to PT.  If worsening may consider xray. Premature at this point to consider.  Red flag signs and symptoms reviewed. If were to occur let us know.  Follow up 2 weeks or sooner if needed.  Back Exercises The following exercises strengthen the muscles that help to support the trunk (torso) and back. They also help to keep the lower back flexible. Doing these exercises can help to prevent or lessen existing low back pain. If you have back pain or discomfort, try doing these exercises 2-3 times each day or as told by your health care provider. As your pain improves, do them once each day, but increase the number of times that you repeat the steps for each exercise (do more repetitions). To prevent the recurrence of back pain, continue to do these exercises once each day or as told by your health care provider. Do exercises exactly as told by your health care provider and adjust them as directed. It is normal to feel mild stretching, pulling, tightness, or discomfort as you do these exercises, but you should stop right away if you feel sudden pain or your pain gets worse. Exercises Single knee to chest Repeat these steps 3-5 times for each leg: Lie on your back on a firm bed or the floor with your legs extended. Bring one knee to your chest. Your other leg should stay extended and in contact with the floor. Hold your knee in place by grabbing your knee or thigh with both hands and hold. Pull on your knee until you feel a gentle stretch in your lower back or buttocks. Hold the stretch for 10-30 seconds. Slowly release and straighten your leg.  Pelvic tilt Repeat these steps 5-10 times: Lie on your back on a firm bed or the floor with your legs extended. Bend your knees so they are  pointing toward the ceiling and your feet are flat on the floor. Tighten your lower abdominal muscles to press your lower back against the floor. This motion will tilt your pelvis so your tailbone points up toward the ceiling instead of pointing to your feet or the floor. With gentle tension and even breathing, hold this position for 5-10 seconds.  Cat-cow Repeat these steps until your lower back becomes more flexible: Get into a hands-and-knees position on a firm bed or the floor. Keep your hands under your shoulders, and keep your knees under your hips. You may place padding under your knees for comfort. Let your head hang down toward your chest. Contract your abdominal muscles and point your tailbone toward the floor so your lower back becomes rounded like the back of a cat. Hold this position for 5 seconds. Slowly lift your head, let your abdominal muscles relax, and point your tailbone up toward the ceiling so your back forms a sagging arch like the back of a cow. Hold this position for 5 seconds.  Press-ups Repeat these steps 5-10 times: Lie on your abdomen (face-down) on a firm bed or the floor. Place your palms near your head, about shoulder-width apart. Keeping your back as relaxed as possible and keeping your hips on the floor, slowly straighten your arms to raise the top half of your body and lift your shoulders. Do not use your back muscles to  raise your upper torso. You may adjust the placement of your hands to make yourself more comfortable. Hold this position for 5 seconds while you keep your back relaxed. Slowly return to lying flat on the floor.  Bridges Repeat these steps 10 times: Lie on your back on a firm bed or the floor. Bend your knees so they are pointing toward the ceiling and your feet are flat on the floor. Your arms should be flat at your sides, next to your body. Tighten your buttocks muscles and lift your buttocks off the floor until your waist is at almost the  same height as your knees. You should feel the muscles working in your buttocks and the back of your thighs. If you do not feel these muscles, slide your feet 1-2 inches (2.5-5 cm) farther away from your buttocks. Hold this position for 3-5 seconds. Slowly lower your hips to the starting position, and allow your buttocks muscles to relax completely. If this exercise is too easy, try doing it with your arms crossed over your chest. Abdominal crunches Repeat these steps 5-10 times: Lie on your back on a firm bed or the floor with your legs extended. Bend your knees so they are pointing toward the ceiling and your feet are flat on the floor. Cross your arms over your chest. Tip your chin slightly toward your chest without bending your neck. Tighten your abdominal muscles and slowly raise your torso high enough to lift your shoulder blades a tiny bit off the floor. Avoid raising your torso higher than that because it can put too much stress on your lower back and does not help to strengthen your abdominal muscles. Slowly return to your starting position.  Back lifts Repeat these steps 5-10 times: Lie on your abdomen (face-down) with your arms at your sides, and rest your forehead on the floor. Tighten the muscles in your legs and your buttocks. Slowly lift your chest off the floor while you keep your hips pressed to the floor. Keep the back of your head in line with the curve in your back. Your eyes should be looking at the floor. Hold this position for 3-5 seconds. Slowly return to your starting position.  Contact a health care provider if: Your back pain or discomfort gets much worse when you do an exercise. Your worsening back pain or discomfort does not lessen within 2 hours after you exercise. If you have any of these problems, stop doing these exercises right away. Do not do them again unless your health care provider says that you can. Get help right away if: You develop sudden, severe  back pain. If this happens, stop doing the exercises right away. Do not do them again unless your health care provider says that you can. This information is not intended to replace advice given to you by your health care provider. Make sure you discuss any questions you have with your health care provider. Document Revised: 11/01/2020 Document Reviewed: 07/20/2020 Elsevier Patient Education  2023 [employer].

## 2022-08-24 NOTE — Progress Notes (Signed)
Subjective:    Patient ID: Juan Collier, male    DOB: 1971-04-18, 52 y.o.   MRN: [id number]  HPI  Pt in for follow up.  Pt states on last visit as he walking to his car felt  intense pinch/pain in his back. It was prominent next day and he states moderate to severe. He dook ibuprofen day 2. Day 3 pain resolved. But next day he tried to get on treadmill and pain increaed again severe. It is still persisting.    He states 2-3 years ago when he had pain like this got better with physical therapy.  States some pain radiating down front of his thigh from back at time intermittent. No leg weakness. No incontinence. No saddle anesthesia.  On review hx of back pain in 2019. Then states got PT.    Review of Systems  Constitutional:  Negative for chills, fatigue and fever.  Respiratory:  Negative for cough, chest tightness, shortness of breath and wheezing.   Cardiovascular:  Negative for chest pain and palpitations.  Gastrointestinal:  Negative for abdominal pain.  Genitourinary:  Negative for dysuria and frequency.  Musculoskeletal:  Positive for back pain. Negative for neck pain and neck stiffness.  Skin:  Negative for rash.  Neurological:  Negative for dizziness, tremors, numbness and headaches.  Hematological:  Negative for adenopathy. Does not bruise/bleed easily.  Psychiatric/Behavioral:  Negative for behavioral problems, decreased concentration and dysphoric mood.     Past Medical History:  Diagnosis Date   Allergy    History of fainting spells of unknown cause    History of stomach ulcers    Rheumatic fever    Tuberculosis positve      Social History   Socioeconomic History   Marital status: Married    Spouse name: Not on file   Number of children: 3   Years of education: Not on file   Highest education level: Not on file  Occupational History   Not on file  Tobacco Use   Smoking status: Never   Smokeless tobacco: Never  Vaping Use   Vaping Use: Never used   Substance and Sexual Activity   Alcohol use: Yes    Alcohol/week: 2.0 standard drinks of alcohol    Types: 2 Cans of beer per week    Comment: occ   Drug use: No   Sexual activity: Yes    Birth control/protection: None  Other Topics Concern   Not on file  Social History Narrative   Not on file   Social Determinants of Health   Financial Resource Strain: Low Risk  (08/22/2022)   Overall Financial Resource Strain (CARDIA)    Difficulty of Paying Living Expenses: Not hard at all  Food Insecurity: No Food Insecurity (08/22/2022)   Hunger Vital Sign    Worried About Running Out of Food in the Last Year: Never true    Ran Out of Food in the Last Year: Never true  Transportation Needs: No Transportation Needs (08/22/2022)   PRAPARE - Administrator, Civil ServiceTransportation    Lack of Transportation (Medical): No    Lack of Transportation (Non-Medical): No  Physical Activity: Inactive (08/22/2022)   Exercise Vital Sign    Days of Exercise per Week: 3 days    Minutes of Exercise per Session: 0 min  Stress: No Stress Concern Present (08/22/2022)   [employer] - Occupational Stress Questionnaire    Feeling of Stress : Not at all  Social Connections: Socially Integrated (08/22/2022)  Social Advertising account executive [NHANES]    Frequency of Communication with Friends and Family: More than three times a week    Frequency of Social Gatherings with Friends and Family: More than three times a week    Attends Religious Services: More than 4 times per year    Active Member of [employer] or Organizations: Yes    Attends Banker Meetings: More than 4 times per year    Marital Status: Married  Catering manager Violence: Not At Risk (08/22/2022)   Humiliation, Afraid, Rape, and Kick questionnaire    Fear of Current or Ex-Partner: No    Emotionally Abused: No    Physically Abused: No    Sexually Abused: No    No past surgical history on file.  Family History  Problem Relation Age  of Onset   Bipolar disorder Maternal Grandmother    Thyroid disease Daughter    ADD / ADHD Son    Liver disease Neg Hx    Esophageal cancer Neg Hx    Colon cancer Neg Hx     Allergies  Allergen Reactions   Mold Extract [Trichophyton]    Oak Bark [Quercus Robur]    Pollen Extract     Current Outpatient Medications on File Prior to Visit  Medication Sig Dispense Refill   azelastine (OPTIVAR) 0.05 % ophthalmic solution Apply 1 drop to eye 2 (two) times daily.     EPINEPHrine 0.3 mg/0.3 mL IJ SOAJ injection Inject into the muscle. (Patient not taking: Reported on 08/22/2022)     levocetirizine (XYZAL) 5 MG tablet 1 tablet in the evening     triamcinolone cream (KENALOG) 0.1 % Apply 1 Application topically 2 (two) times daily. 30 g 0   No current facility-administered medications on file prior to visit.    BP 112/88   Pulse 86   Temp 98 F (36.7 C)   Ht 5\' 9"  (1.753 m)   Wt 172 lb (78 kg)   SpO2 100%   BMI 25.40 kg/m        Objective:   Physical Exam  General Appearance- Not in acute distress.   Chest and Lung Exam Auscultation: Breath sounds:-Normal. Clear even and unlabored. Adventitious sounds:- No Adventitious sounds.  Cardiovascular Auscultation:Rythm - Regular, rate and rythm. Heart Sounds -Normal heart sounds.  Abdomen Inspection:-Inspection Normal.  Palpation/Perucssion: Palpation and Percussion of the abdomen reveal- Non Tender, No Rebound tenderness, No rigidity(Guarding) and No Palpable abdominal masses.  Liver:-Normal.  Spleen:- Normal.   Back Mid lumbar spine tenderness to palpation. But also bilateral si tenderness Faint pain on straight leg lift. Pain on lateral movements and flexion/extension of the spine.  Lower ext neurologic  L5-S1 sensation intact bilaterally. Normal patellar reflexes bilaterally. No foot drop bilaterally.       Assessment & Plan:   Patient Instructions  Low back pain with some features of sciatica.   Will  prescribe medrol 4 mg 6 day taper, flexeril 10 mg muscle relaxant to use at night and advise stretching exercises as tolerated.  If symptoms persist can refer to PT.  If worsening may consider xray. Premature at this point to consider.  Red flag signs and symptoms reviewed. If were to occur let us know.  Follow up 2 weeks or sooner if needed.    [name], PA-C

## 2022-08-27 ENCOUNTER — Encounter: Payer: Self-pay | Admitting: Adult Health

## 2022-08-27 ENCOUNTER — Ambulatory Visit (INDEPENDENT_AMBULATORY_CARE_PROVIDER_SITE_OTHER): Payer: BC Managed Care – PPO | Admitting: Adult Health

## 2022-08-27 VITALS — BP 130/60 | HR 74 | Ht 69.0 in | Wt 177.8 lb

## 2022-08-27 DIAGNOSIS — R0683 Snoring: Secondary | ICD-10-CM

## 2022-08-27 HISTORY — DX: Snoring: R06.83

## 2022-08-27 NOTE — Patient Instructions (Addendum)
Set up for home sleep study  Healthy sleep regimen  Decrease Caffeine intake.  Do not drive if sleeping  Follow up in 6 weeks to discuss results and treatment plan .

## 2022-08-27 NOTE — Progress Notes (Signed)
@Patient  ID: Juan Collier, male    DOB: Jul 20, 1970, 52 y.o.   MRN: [id number]  Chief Complaint  Patient presents with   Consult    Referring provider: Marisue Brooklyn  HPI: 52 year old male seen for sleep consult on August 27, 2022 for restless sleep, snoring, daytime sleepiness   TEST/EVENTS :   08/27/2022 Sleep consult  Patient presents for a sleep consult today.  Kindly referred by primary care provider [name], PA .  Patient complains of very loud snoring, restless sleep, daytime sleepiness. Has snoring and daytime sleepiness for long time. Over last year starting to feel very tired.  Patient typically goes to sleep about midnight.  Only takes a few minutes to go to sleep.  Up 2 or 3 times at night.  Gets up around 6 AM.  Weight is up about 20 pounds over the last 2 years.  Current weight is at 177 pounds and BMI at 26.  Occasionally takes melatonin.  Naps most days 1 hr. Is very sleepy.  Is very tired each day especially in evening hours. Can fall asleep while talking at times.   Drinks Coffee 7 cups of day. Can drink coffee prior to bed and still goes to sleep .  Epworth score is 20 out of 24.  Gets sleepy if he sits down to watch TV or rest.  Also sleepy in the evening hours. Can not watch TV, falls asleep watching.  Patient did has any known history of congestive heart failure or stroke.  Had Covid 19 infection in December 2023 , seems to more tired since then.   Social history patient is married.  Lives at home with his wife.  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  Has 3 children.  He is a never smoker.  Social alcohol.  No drug use. From [place]. [place]. Moved to [place] 51yrs ago.   FH -Throat cancer.   PMH :  PPD positive. Chest xray 2007 clear.  Hyperlipidemia  Low testosterone.  Allergies .    History reviewed. No pertinent surgical history.   Allergies  Allergen Reactions   Mold Extract [Trichophyton]    Oak Bark [Quercus Robur]    Pollen  Extract     Immunization History  Administered Date(s) Administered   Influenza,inj,Quad PF,6+ Mos 05/30/2013, 03/12/2019, 06/22/2020, 04/20/2022   PFIZER(Purple Top)SARS-COV-2 Vaccination 04/05/2020   Tdap 06/22/2020   Zoster Recombinat (Shingrix) 07/25/2021    Past Medical History:  Diagnosis Date   Allergy    History of fainting spells of unknown cause    History of stomach ulcers    Rheumatic fever    Tuberculosis positve     Tobacco History: Social History   Tobacco Use  Smoking Status Never  Smokeless Tobacco Never   Counseling given: Not Answered   Outpatient Medications Prior to Visit  Medication Sig Dispense Refill   azelastine (OPTIVAR) 0.05 % ophthalmic solution Apply 1 drop to eye 2 (two) times daily.     cetirizine (ZYRTEC) 10 MG chewable tablet Chew 10 mg by mouth daily.     EPINEPHrine 0.3 mg/0.3 mL IJ SOAJ injection Inject into the muscle.     triamcinolone cream (KENALOG) 0.1 % Apply 1 Application topically 2 (two) times daily. 30 g 0   levocetirizine (XYZAL) 5 MG tablet 1 tablet in the evening (Patient not taking: Reported on 08/27/2022)     cyclobenzaprine (FLEXERIL) 10 MG tablet Take 1 tablet (10 mg total) by mouth at  bedtime. (Patient not taking: Reported on 08/27/2022) 5 tablet 0   methylPREDNISolone (MEDROL) 4 MG tablet Standard 6 day taper dose (Patient not taking: Reported on 08/27/2022) 21 tablet 0   No facility-administered medications prior to visit.     Review of Systems:   Constitutional:   No  weight loss, night sweats,  Fevers, chills, + fatigue, or  lassitude.  HEENT:   No headaches,  Difficulty swallowing,  Tooth/dental problems, or  Sore throat,                No sneezing, itching, ear ache, nasal congestion, post nasal drip,   CV:  No chest pain,  Orthopnea, PND, swelling in lower extremities, anasarca, dizziness, palpitations, syncope.   GI  No heartburn, indigestion, abdominal pain, nausea, vomiting, diarrhea, change in bowel  habits, loss of appetite, bloody stools.   Resp: No shortness of breath with exertion or at rest.  No excess mucus, no productive cough,  No non-productive cough,  No coughing up of blood.  No change in color of mucus.  No wheezing.  No chest wall deformity  Skin: no rash or lesions.  GU: no dysuria, change in color of urine, no urgency or frequency.  No flank pain, no hematuria   MS:  No joint pain or swelling.  No decreased range of motion.  No back pain.    Physical Exam  BP 130/60 (BP Location: Left Arm, Patient Position: Sitting, Cuff Size: Normal)   Pulse 74   Ht 5\' 9"  (1.753 m)   Wt 177 lb 12.8 oz (80.6 kg)   SpO2 96%   BMI 26.26 kg/m   GEN: A/Ox3; pleasant , NAD, well nourished    HEENT:  [place]/AT,  NOSE-clear, THROAT-clear, no lesions, no postnasal drip or exudate noted. Class 2-3 MP airway   NECK:  Supple w/ fair ROM; no JVD; normal carotid impulses w/o bruits; no thyromegaly or nodules palpated; no lymphadenopathy.    RESP  Clear  P & A; w/o, wheezes/ rales/ or rhonchi. no accessory muscle use, no dullness to percussion  CARD:  RRR, no m/r/g, no peripheral edema, pulses intact, no cyanosis or clubbing.  GI:   Soft & nt; nml bowel sounds; no organomegaly or masses detected.   Musco: Warm bil, no deformities or joint swelling noted.   Neuro: alert, no focal deficits noted.    Skin: Warm, no lesions or rashes    Lab Results:  CBC  BNP No results found for: "BNP"  ProBNP No results found for: "PROBNP"  Imaging: No results found.        No data to display          No results found for: "NITRICOXIDE"      Assessment & Plan:   Snoring Snoring, daytime sleepiness, restless sleep all suspicious for OSA . Set up for Home sleep study . If negative will need NPSG followed by Multi sleep latency test  Discussed healthy sleep regimen with decreased caffeine. Went over OSA symptoms  - discussed how weight can impact sleep and risk for sleep  disordered breathing - discussed options to assist with weight loss: combination of diet modification, cardiovascular and strength training exercises   - had an extensive discussion regarding the adverse health consequences related to untreated sleep disordered breathing - specifically discussed the risks for hypertension, coronary artery disease, cardiac dysrhythmias, cerebrovascular disease, and diabetes - lifestyle modification discussed   - discussed how sleep disruption can increase risk of accidents, particularly when  driving - safe driving practices were discussed   Plan  Patient Instructions  Set up for home sleep study  Healthy sleep regimen  Decrease Caffeine intake.  Do not drive if sleeping  Follow up in 6 weeks to discuss results and treatment plan .        Rubye Oaksammy Jamilia Jacques, NP 08/27/2022

## 2022-08-27 NOTE — Progress Notes (Signed)
Reviewed and agree with assessment/plan.   Coralyn Helling, MD [facility] Pulmonary/Critical Care 08/27/2022, 5:25 PM Pager:  [phone]

## 2022-08-27 NOTE — Assessment & Plan Note (Addendum)
Snoring, daytime sleepiness, restless sleep all suspicious for OSA . Set up for Home sleep study . If negative will need NPSG followed by Multi sleep latency test  Discussed healthy sleep regimen with decreased caffeine. Went over OSA symptoms  - discussed how weight can impact sleep and risk for sleep disordered breathing - discussed options to assist with weight loss: combination of diet modification, cardiovascular and strength training exercises   - had an extensive discussion regarding the adverse health consequences related to untreated sleep disordered breathing - specifically discussed the risks for hypertension, coronary artery disease, cardiac dysrhythmias, cerebrovascular disease, and diabetes - lifestyle modification discussed   - discussed how sleep disruption can increase risk of accidents, particularly when driving - safe driving practices were discussed   Plan  Patient Instructions  Set up for home sleep study  Healthy sleep regimen  Decrease Caffeine intake.  Do not drive if sleeping  Follow up in 6 weeks to discuss results and treatment plan .

## 2022-08-28 ENCOUNTER — Encounter: Payer: Self-pay | Admitting: Medical

## 2022-09-03 ENCOUNTER — Encounter: Payer: Self-pay | Admitting: *Deleted

## 2022-09-03 ENCOUNTER — Encounter: Payer: Self-pay | Admitting: Family

## 2022-09-04 ENCOUNTER — Telehealth: Payer: Self-pay | Admitting: *Deleted

## 2022-09-04 NOTE — Telephone Encounter (Signed)
Per 08/22/22 los -  Pending lab results.

## 2022-09-06 DIAGNOSIS — R0683 Snoring: Secondary | ICD-10-CM

## 2022-09-06 LAB — HEMOCHROMATOSIS DNA-PCR(C282Y,H63D)

## 2022-09-07 ENCOUNTER — Telehealth: Payer: Self-pay

## 2022-09-07 NOTE — Telephone Encounter (Signed)
-----   Message from Erenest Blank, NP sent at 09/07/2022 12:52 PM EDT ----- Hemochromatosis DNA was negative!!! No follow-up with our office needed. Juan Collier!!! Thank you!   Sarah  ----- Message ----- From: Leory Plowman, Lab In [place] Sent: 08/22/2022  11:00 AM EDT To: Erenest Blank, NP

## 2022-09-11 NOTE — Addendum Note (Signed)
Addended by: Gwenevere Abbot on: 09/11/2022 02:15 PM   Modules accepted: Orders

## 2022-09-20 ENCOUNTER — Telehealth: Payer: Self-pay | Admitting: Adult Health

## 2022-09-20 NOTE — Telephone Encounter (Signed)
Patient called to inform the doctor that he is very dissatisfied with the [facility] company regarding his cpap.  He said that he would like to know if there is another company that can work with him.  Please advise and call to discuss further at [phone]

## 2022-09-26 NOTE — Progress Notes (Deleted)
Referring-Edward Saguier PA-C Reason for referral-hyperlipidemia  HPI: 52 year old male for evaluation of hyperlipidemia at request of [employer].  Laboratories March 2024 showed total cholesterol 225, triglycerides 208 and LDL 133.  TSH 3.01.  Current Outpatient Medications  Medication Sig Dispense Refill   azelastine (OPTIVAR) 0.05 % ophthalmic solution Apply 1 drop to eye 2 (two) times daily.     cetirizine (ZYRTEC) 10 MG chewable tablet Chew 10 mg by mouth daily.     EPINEPHrine 0.3 mg/0.3 mL IJ SOAJ injection Inject into the muscle.     levocetirizine (XYZAL) 5 MG tablet 1 tablet in the evening (Patient not taking: Reported on 08/27/2022)     triamcinolone cream (KENALOG) 0.1 % Apply 1 Application topically 2 (two) times daily. 30 g 0   No current facility-administered medications for this visit.    Allergies  Allergen Reactions   Mold Extract [Trichophyton]    Oak Bark [Quercus Robur]    Pollen Extract      Past Medical History:  Diagnosis Date   Allergy    History of fainting spells of unknown cause    History of stomach ulcers    Rheumatic fever    Tuberculosis positve     No past surgical history on file.  Social History   Socioeconomic History   Marital status: Married    Spouse name: Not on file   Number of children: 3   Years of education: Not on file   Highest education level: Not on file  Occupational History   Not on file  Tobacco Use   Smoking status: Never   Smokeless tobacco: Never  Vaping Use   Vaping Use: Never used  Substance and Sexual Activity   Alcohol use: Yes    Alcohol/week: 2.0 standard drinks of alcohol    Types: 2 Cans of beer per week    Comment: occ   Drug use: No   Sexual activity: Yes    Birth control/protection: None  Other Topics Concern   Not on file  Social History Narrative   Not on file   Social Determinants of Health   Financial Resource Strain: Low Risk  (08/22/2022)   Overall Financial Resource  Strain (CARDIA)    Difficulty of Paying Living Expenses: Not hard at all  Food Insecurity: No Food Insecurity (08/22/2022)   Hunger Vital Sign    Worried About Running Out of Food in the Last Year: Never true    Ran Out of Food in the Last Year: Never true  Transportation Needs: No Transportation Needs (08/22/2022)   PRAPARE - Administrator, Civil Service (Medical): No    Lack of Transportation (Non-Medical): No  Physical Activity: Inactive (08/22/2022)   Exercise Vital Sign    Days of Exercise per Week: 3 days    Minutes of Exercise per Session: 0 min  Stress: No Stress Concern Present (08/22/2022)   [employer] of Occupational Health - Occupational Stress Questionnaire    Feeling of Stress : Not at all  Social Connections: Socially Integrated (08/22/2022)   Social Connection and Isolation Panel [NHANES]    Frequency of Communication with Friends and Family: More than three times a week    Frequency of Social Gatherings with Friends and Family: More than three times a week    Attends Religious Services: More than 4 times per year    Active Member of [employer] or Organizations: Yes    Attends Banker Meetings: More than  4 times per year    Marital Status: Married  Catering manager Violence: Not At Risk (08/22/2022)   Humiliation, Afraid, Rape, and Kick questionnaire    Fear of Current or Ex-Partner: No    Emotionally Abused: No    Physically Abused: No    Sexually Abused: No    Family History  Problem Relation Age of Onset   Bipolar disorder Maternal Grandmother    Thyroid disease Daughter    ADD / ADHD Son    Liver disease Neg Hx    Esophageal cancer Neg Hx    Colon cancer Neg Hx     ROS: no fevers or chills, productive cough, hemoptysis, dysphasia, odynophagia, melena, hematochezia, dysuria, hematuria, rash, seizure activity, orthopnea, PND, pedal edema, claudication. Remaining systems are negative.  Physical Exam:   There were no vitals taken for this  visit.  General:  Well developed/well nourished in NAD Skin warm/dry Patient not depressed No peripheral clubbing Back-normal HEENT-normal/normal eyelids Neck supple/normal carotid upstroke bilaterally; no bruits; no JVD; no thyromegaly chest - CTA/ normal expansion CV - RRR/normal S1 and S2; no murmurs, rubs or gallops;  PMI nondisplaced Abdomen -NT/ND, no HSM, no mass, + bowel sounds, no bruit 2+ femoral pulses, no bruits Ext-no edema, chords, 2+ DP Neuro-grossly nonfocal  ECG - personally reviewed  A/P  1 hyperlipidemia-  Olga Millers, MD

## 2022-09-27 NOTE — Telephone Encounter (Signed)
Left message for patient to return call regarding the CPAP/or HST and [facility].

## 2022-10-03 ENCOUNTER — Ambulatory Visit: Payer: BC Managed Care – PPO | Admitting: Cardiology

## 2022-10-04 ENCOUNTER — Ambulatory Visit: Payer: BC Managed Care – PPO | Attending: Cardiology | Admitting: Cardiology

## 2022-10-05 ENCOUNTER — Telehealth: Payer: Self-pay | Admitting: *Deleted

## 2022-10-05 NOTE — Telephone Encounter (Signed)
ATC patient x1.  Left detailed message (DPR).  Requested that he call [facility] to schedule his HST and that I was canceling his f/u for Monday, 10/08/2022.  Advised to call after his sleep study.

## 2022-10-08 ENCOUNTER — Ambulatory Visit: Payer: BC Managed Care – PPO | Admitting: Adult Health

## 2022-12-18 ENCOUNTER — Other Ambulatory Visit: Payer: Self-pay

## 2022-12-18 DIAGNOSIS — I00 Rheumatic fever without heart involvement: Secondary | ICD-10-CM | POA: Insufficient documentation

## 2022-12-18 DIAGNOSIS — Z9189 Other specified personal risk factors, not elsewhere classified: Secondary | ICD-10-CM | POA: Insufficient documentation

## 2022-12-18 DIAGNOSIS — Z8711 Personal history of peptic ulcer disease: Secondary | ICD-10-CM | POA: Insufficient documentation

## 2022-12-18 DIAGNOSIS — A159 Respiratory tuberculosis unspecified: Secondary | ICD-10-CM | POA: Insufficient documentation

## 2022-12-18 DIAGNOSIS — T7840XA Allergy, unspecified, initial encounter: Secondary | ICD-10-CM | POA: Insufficient documentation

## 2022-12-26 ENCOUNTER — Ambulatory Visit: Payer: BC Managed Care – PPO | Attending: Cardiology | Admitting: Cardiology

## 2022-12-26 ENCOUNTER — Encounter: Payer: Self-pay | Admitting: Cardiology

## 2022-12-26 VITALS — BP 102/66 | HR 70 | Ht 68.0 in | Wt 176.1 lb

## 2022-12-26 DIAGNOSIS — Z01810 Encounter for preprocedural cardiovascular examination: Secondary | ICD-10-CM

## 2022-12-26 DIAGNOSIS — R011 Cardiac murmur, unspecified: Secondary | ICD-10-CM | POA: Diagnosis not present

## 2022-12-26 NOTE — Progress Notes (Signed)
Cardiology Office Note:    Date:  12/26/2022   ID:  Juan Collier, DOB 1970/11/11, MRN [id number]  PCP:  [name], PA-C  Cardiologist:  Garwin Brothers, MD   Referring MD: [name], PA-C    ASSESSMENT:    Cardiac Murmur Hyperlipidemia PLAN:    In order of problems listed above:  Primary prevention stressed with the patient.  Importance of compliance with diet medication stressed and patient verbalized standing. Patient mentions to me that he walks up to 3 miles a day without any problems.  I congratulated him about this. Cardiac murmur: Echocardiogram will be done to assess murmur heard on auscultation. Mixed dyslipidemia: Lipids were reviewed.  For restratification I recommended a calcium score assessment and he is agreeable.  Further recommendations will be made based on the findings of that test. Patient will be seen in follow-up appointment in 9 months or earlier if the patient has any concerns.    Medication Adjustments/Labs and Tests Ordered: Current medicines are reviewed at length with the patient today.  Concerns regarding medicines are outlined above.  Orders Placed This Encounter  Procedures   EKG 12-Lead   No orders of the defined types were placed in this encounter.    History of Present Illness:    Juan Collier is a 52 y.o. male who is being seen today for the evaluation of cardiac murmur at the request of Saguier, Ramon Dred[place], [place].  Patient is a pleasant 52 year old male.  He has past medical history of rheumatic fever and rheumatic arthritis as a child.  Patient mentions to me that he has had history of fatigue since he has had the COVID in December last year.  Subsequently has done well.  No chest pain orthopnea or PND.  At the time of my evaluation, the patient is alert awake oriented and in no distress.  Past Medical History:  Diagnosis Date   Allergy    History of fainting spells of unknown cause    History of stomach ulcers    Left  shoulder pain 07/02/2016   06/14/2016.  [facility].  Lunette Stands, MD.  Question labral tear.  I would recommend MR arthrogram as he has failed 3 mths of conservative tx.  He does agree to proceed.  That will be scheduled and I will get back with him when that is available for review.     Rheumatic fever    Snoring 08/27/2022   Tuberculosis positve    WRIST PAIN, LEFT 07/13/2010   Qualifier: Diagnosis of   By: Pearletha Forge MD, Vincenza Hews          Past Surgical History:  Procedure Laterality Date   NO PAST SURGERIES      Current Medications: Current Meds  Medication Sig   EPINEPHrine 0.3 mg/0.3 mL IJ SOAJ injection Inject 0.3 mg into the muscle as needed for anaphylaxis.     Allergies:   Mold extract [trichophyton], Oak bark [quercus robur], and Pollen extract   Social History   Socioeconomic History   Marital status: Married    Spouse name: Not on file   Number of children: 3   Years of education: Not on file   Highest education level: Not on file  Occupational History   Not on file  Tobacco Use   Smoking status: Never   Smokeless tobacco: Never  Vaping Use   Vaping status: Never Used  Substance and Sexual Activity   Alcohol use: Yes    Alcohol/week: 2.0 standard  drinks of alcohol    Types: 2 Cans of beer per week    Comment: occ   Drug use: No   Sexual activity: Yes    Birth control/protection: None  Other Topics Concern   Not on file  Social History Narrative   Not on file   Social Determinants of Health   Financial Resource Strain: Low Risk  (08/22/2022)   Overall Financial Resource Strain (CARDIA)    Difficulty of Paying Living Expenses: Not hard at all  Food Insecurity: No Food Insecurity (08/22/2022)   Hunger Vital Sign    Worried About Running Out of Food in the Last Year: Never true    Ran Out of Food in the Last Year: Never true  Transportation Needs: No Transportation Needs (08/22/2022)   PRAPARE - Scientist, research (physical sciences) (Medical): No    Lack of Transportation (Non-Medical): No  Physical Activity: Inactive (08/22/2022)   Exercise Vital Sign    Days of Exercise per Week: 3 days    Minutes of Exercise per Session: 0 min  Stress: No Stress Concern Present (08/22/2022)   [employer] of Occupational Health - Occupational Stress Questionnaire    Feeling of Stress : Not at all  Social Connections: Socially Integrated (08/22/2022)   Social Connection and Isolation Panel [NHANES]    Frequency of Communication with Friends and Family: More than three times a week    Frequency of Social Gatherings with Friends and Family: More than three times a week    Attends Religious Services: More than 4 times per year    Active Member of [employer] or Organizations: Yes    Attends Engineer, structural: More than 4 times per year    Marital Status: Married     Family History: The patient's family history includes ADD / ADHD in his son; Bipolar disorder in his maternal grandmother; Thyroid disease in his daughter. There is no history of Liver disease, Esophageal cancer, or Colon cancer.  ROS:   Please see the history of present illness.    All other systems reviewed and are negative.  EKGs/Labs/Other Studies Reviewed:    The following studies were reviewed today: .Marland KitchenEKG Interpretation Date/Time:  Wednesday December 26 2022 16:04:05 EDT Ventricular Rate:  70 PR Interval:  158 QRS Duration:  84 QT Interval:  394 QTC Calculation: 425 R Axis:   73  Text Interpretation: Normal sinus rhythm Normal ECG No previous ECGs available Confirmed by Belva Crome [phone]) on 12/26/2022 4:11:39 PM         Recent Labs: 08/09/2022: TSH 3.01 08/22/2022: ALT 38; BUN 11; Creatinine 0.95; Hemoglobin 16.8; Platelet Count 295; Potassium 4.1; Sodium 138  Recent Lipid Panel    Component Value Date/Time   CHOL 225 (H) 08/09/2022 0809   TRIG 208.0 (H) 08/09/2022 0809   HDL 55.60 08/09/2022 0809   CHOLHDL 4 08/09/2022 0809    VLDL 41.6 (H) 08/09/2022 0809   LDLCALC 122 (H) 04/20/2022 0856   LDLDIRECT 133.0 08/09/2022 0809    Physical Exam:    VS:  BP 102/66   Pulse 70   Ht 5\' 8"  (1.727 m)   Wt 176 lb 1.3 oz (79.9 kg)   SpO2 96%   BMI 26.77 kg/m     Wt Readings from Last 3 Encounters:  12/26/22 176 lb 1.3 oz (79.9 kg)  08/27/22 177 lb 12.8 oz (80.6 kg)  08/24/22 172 lb (78 kg)     GEN: Patient is in no acute  distress HEENT: Normal NECK: No JVD; No carotid bruits LYMPHATICS: No lymphadenopathy CARDIAC: S1 S2 regular, 2/6 systolic murmur at the apex. RESPIRATORY:  Clear to auscultation without rales, wheezing or rhonchi  ABDOMEN: Soft, non-tender, non-distended MUSCULOSKELETAL:  No edema; No deformity  SKIN: Warm and dry NEUROLOGIC:  Alert and oriented x 3 PSYCHIATRIC:  Normal affect    Signed, Garwin Brothers, MD  12/26/2022 4:11 PM     [facility]

## 2022-12-26 NOTE — Patient Instructions (Addendum)
Medication Instructions:  Your physician recommends that you continue on your current medications as directed. Please refer to the Current Medication list given to you today.  *If you need a refill on your cardiac medications before your next appointment, please call your pharmacy*  Lab Work: None ordered today.  Testing/Procedures: Your physician has requested that you have a coronary calcium score performed. This is not covered by insurance and will be an out-of-pocket cost of approximately $99.   Your physician has requested that you have an echocardiogram. Echocardiography is a painless test that uses sound waves to create images of your heart. It provides your doctor with information about the size and shape of your heart and how well your heart's chambers and valves are working. This procedure takes approximately one hour. There are no restrictions for this procedure. Please do NOT wear cologne, perfume, aftershave, or lotions (deodorant is allowed). Please arrive 15 minutes prior to your appointment time.  Follow-Up: At [facility], you and your health needs are our priority.  As part of our continuing mission to provide you with exceptional heart care, we have created designated Provider Care Teams.  These Care Teams include your primary Cardiologist (physician) and Advanced Practice Providers (APPs -  Physician Assistants and Nurse Practitioners) who all work together to provide you with the care you need, when you need it.  Your next appointment:   9 month(s)  The format for your next appointment:   In Person  Provider:   Belva Crome, MD{

## 2022-12-26 NOTE — Addendum Note (Signed)
Addended by: Franchot Gallo on: 12/26/2022 04:36 PM   Modules accepted: Orders

## 2023-01-02 ENCOUNTER — Other Ambulatory Visit (HOSPITAL_BASED_OUTPATIENT_CLINIC_OR_DEPARTMENT_OTHER): Payer: BC Managed Care – PPO

## 2023-01-10 ENCOUNTER — Ambulatory Visit (HOSPITAL_BASED_OUTPATIENT_CLINIC_OR_DEPARTMENT_OTHER)
Admission: RE | Admit: 2023-01-10 | Discharge: 2023-01-10 | Disposition: A | Discharge status: Home / Self Care | Payer: BC Managed Care – PPO | Source: Ambulatory Visit | Attending: Cardiology | Admitting: Cardiology

## 2023-01-10 DIAGNOSIS — Z01810 Encounter for preprocedural cardiovascular examination: Secondary | ICD-10-CM | POA: Insufficient documentation

## 2023-01-17 ENCOUNTER — Telehealth: Payer: Self-pay

## 2023-01-17 DIAGNOSIS — I700 Atherosclerosis of aorta: Secondary | ICD-10-CM

## 2023-01-17 DIAGNOSIS — R931 Abnormal findings on diagnostic imaging of heart and coronary circulation: Secondary | ICD-10-CM

## 2023-01-17 NOTE — Telephone Encounter (Signed)
-----   Message from Aundra Dubin Revankar sent at 01/17/2023  1:48 PM EDT ----- Elevated calcium score.  Aortic atherosclerosis.  Please bring him for a Chem-7 liver lipid check. Garwin Brothers, MD 01/17/2023 1:48 PM

## 2023-01-31 ENCOUNTER — Encounter: Payer: Self-pay | Admitting: Cardiology

## 2023-01-31 ENCOUNTER — Ambulatory Visit (HOSPITAL_BASED_OUTPATIENT_CLINIC_OR_DEPARTMENT_OTHER): Payer: BC Managed Care – PPO

## 2023-02-16 LAB — LIPID PANEL
Chol/HDL Ratio: 4.6 {ratio} (ref 0.0–5.0)
Cholesterol, Total: 231 mg/dL — ABNORMAL HIGH (ref 100–199)
HDL: 50 mg/dL (ref >39)
LDL Chol Calc (NIH): 154 mg/dL — ABNORMAL HIGH (ref 0–99)
Triglycerides: 148 mg/dL (ref 0–149)
VLDL Cholesterol Cal: 27 mg/dL (ref 5–40)

## 2023-02-16 LAB — COMPREHENSIVE METABOLIC PANEL
ALT: 32 [IU]/L (ref 0–44)
AST: 26 [IU]/L (ref 0–40)
Albumin: 4.6 g/dL (ref 3.8–4.9)
Alkaline Phosphatase: 65 [IU]/L (ref 44–121)
BUN/Creatinine Ratio: 12 (ref 9–20)
BUN: 12 mg/dL (ref 6–24)
Bilirubin Total: 0.5 mg/dL (ref 0.0–1.2)
CO2: 23 mmol/L (ref 20–29)
Calcium: 9.6 mg/dL (ref 8.7–10.2)
Chloride: 102 mmol/L (ref 96–106)
Creatinine, Ser: 1.01 mg/dL (ref 0.76–1.27)
Globulin, Total: 3.1 g/dL (ref 1.5–4.5)
Glucose: 101 mg/dL — ABNORMAL HIGH (ref 70–99)
Potassium: 4.9 mmol/L (ref 3.5–5.2)
Sodium: 142 mmol/L (ref 134–144)
Total Protein: 7.7 g/dL (ref 6.0–8.5)
eGFR: 88 mL/min/{1.73_m2} (ref >59)

## 2023-02-18 ENCOUNTER — Telehealth: Payer: Self-pay

## 2023-02-18 DIAGNOSIS — R931 Abnormal findings on diagnostic imaging of heart and coronary circulation: Secondary | ICD-10-CM

## 2023-02-18 DIAGNOSIS — E785 Hyperlipidemia, unspecified: Secondary | ICD-10-CM

## 2023-02-18 MED ORDER — ROSUVASTATIN CALCIUM 10 MG PO TABS: 10.0000 mg | ORAL_TABLET | Freq: Every day | ORAL | 3 refills | Status: DC

## 2023-02-18 NOTE — Telephone Encounter (Signed)
Viewed in MyChart Routed to PCP  

## 2023-02-18 NOTE — Telephone Encounter (Signed)
-----   Message from Aundra Dubin Revankar sent at 02/16/2023 10:54 PM EDT ----- Diet, exercise, crestor 10mg  and LL 6wks ccpcp Garwin Brothers, MD 02/16/2023 10:53 PM

## 2023-04-05 LAB — LIPID PANEL
Chol/HDL Ratio: 2.5 {ratio} (ref 0.0–5.0)
Cholesterol, Total: 133 mg/dL (ref 100–199)
HDL: 53 mg/dL (ref >39)
LDL Chol Calc (NIH): 59 mg/dL (ref 0–99)
Triglycerides: 118 mg/dL (ref 0–149)
VLDL Cholesterol Cal: 21 mg/dL (ref 5–40)

## 2023-04-05 LAB — HEPATIC FUNCTION PANEL
ALT: 27 [IU]/L (ref 0–44)
AST: 24 [IU]/L (ref 0–40)
Albumin: 4.6 g/dL (ref 3.8–4.9)
Alkaline Phosphatase: 61 [IU]/L (ref 44–121)
Bilirubin Total: 0.4 mg/dL (ref 0.0–1.2)
Bilirubin, Direct: 0.16 mg/dL (ref 0.00–0.40)
Total Protein: 7.4 g/dL (ref 6.0–8.5)

## 2023-04-10 NOTE — Telephone Encounter (Signed)
Patient notified through my chart and results sent to referring physician

## 2023-10-23 ENCOUNTER — Ambulatory Visit: Payer: Self-pay | Attending: Cardiology | Admitting: Cardiology

## 2023-10-23 ENCOUNTER — Encounter: Payer: Self-pay | Admitting: Cardiology

## 2023-10-23 VITALS — BP 100/68 | HR 71 | Ht 68.0 in | Wt 180.8 lb

## 2023-10-23 DIAGNOSIS — E782 Mixed hyperlipidemia: Secondary | ICD-10-CM | POA: Insufficient documentation

## 2023-10-23 DIAGNOSIS — I700 Atherosclerosis of aorta: Secondary | ICD-10-CM | POA: Diagnosis not present

## 2023-10-23 NOTE — Progress Notes (Signed)
 Cardiology Office Note:    Date:  10/23/2023   ID:  [name], DOB 06/10/1970, MRN [id number]  PCP:  Sylvia Everts, PA-C  Cardiologist:  Nelia Balzarine, MD   Referring MD: Sylvia Everts, PA-C    ASSESSMENT:    1. Aortic atherosclerosis (HCC)   2. Mixed dyslipidemia    PLAN:    In order of problems listed above:  Primary prevention stressed with the patient.  Importance of compliance with diet medication stressed and patient verbalized standing. Aortic atherosclerosis: I discussed findings with the patient at length and questions were answered to satisfaction. Mixed dyslipidemia: On lipid-lowering medications.  Lipids reviewed and they are at goal.  This is followed by primary care. Patient will be seen in follow-up appointment in 12 months or earlier if the patient has any concerns.    Medication Adjustments/Labs and Tests Ordered: Current medicines are reviewed at length with the patient today.  Concerns regarding medicines are outlined above.  No orders of the defined types were placed in this encounter.  No orders of the defined types were placed in this encounter.    No chief complaint on file.    History of Present Illness:    Juan Collier is a 53 y.o. male.  Patient has past medical history of aortic atherosclerosis and mixed dyslipidemia.  He denies any problems at this time and takes care of activities of daily living.  No chest pain orthopnea PND.  He jogs 3 miles a day on a regular basis without any symptoms.  At the time of my evaluation, the patient is alert awake oriented and in no distress.  Past Medical History:  Diagnosis Date   Allergy    Backache 07/13/2010   Qualifier: Diagnosis of   By: Peggy Bowens MD, Berle Breeding SNOMED Dx Update Oct 2024     History of fainting spells of unknown cause    History of stomach ulcers    Left shoulder pain 07/02/2016   06/14/2016.  [facility].  Alix Aquas, MD.  Question labral  tear.  I would recommend MR arthrogram as he has failed 3 mths of conservative tx.  He does agree to proceed.  That will be scheduled and I will get back with him when that is available for review.     Rheumatic fever    Snoring 08/27/2022   Tuberculosis positve    WRIST PAIN, LEFT 07/13/2010   Qualifier: Diagnosis of   By: Peggy Bowens MD, Jimmye Moulds          Past Surgical History:  Procedure Laterality Date   NO PAST SURGERIES      Current Medications: Current Meds  Medication Sig   azelastine (ASTELIN) 0.1 % nasal spray Place 1 spray into both nostrils as needed.   EPINEPHrine 0.3 mg/0.3 mL IJ SOAJ injection Inject 0.3 mg into the muscle as needed for anaphylaxis.   rosuvastatin  (CRESTOR ) 10 MG tablet Take 1 tablet (10 mg total) by mouth daily.     Allergies:   Mold extract [trichophyton], Oak bark [quercus robur], and Pollen extract   Social History   Socioeconomic History   Marital status: Married    Spouse name: Not on file   Number of children: 3   Years of education: Not on file   Highest education level: Not on file  Occupational History   Not on file  Tobacco Use   Smoking status: Never   Smokeless tobacco: Never  Vaping Use  Vaping status: Never Used  Substance and Sexual Activity   Alcohol use: Yes    Alcohol/week: 2.0 standard drinks of alcohol    Types: 2 Cans of beer per week    Comment: occ   Drug use: No   Sexual activity: Yes    Birth control/protection: None  Other Topics Concern   Not on file  Social History Narrative   Not on file   Social Drivers of Health   Financial Resource Strain: Low Risk  (08/22/2022)   Overall Financial Resource Strain (CARDIA)    Difficulty of Paying Living Expenses: Not hard at all  Food Insecurity: No Food Insecurity (08/22/2022)   Hunger Vital Sign    Worried About Running Out of Food in the Last Year: Never true    Ran Out of Food in the Last Year: Never true  Transportation Needs: No Transportation Needs (08/22/2022)    PRAPARE - Administrator, Civil Service (Medical): No    Lack of Transportation (Non-Medical): No  Physical Activity: Inactive (08/22/2022)   Exercise Vital Sign    Days of Exercise per Week: 3 days    Minutes of Exercise per Session: 0 min  Stress: No Stress Concern Present (08/22/2022)   [employer] of Occupational Health - Occupational Stress Questionnaire    Feeling of Stress : Not at all  Social Connections: Socially Integrated (08/22/2022)   Social Connection and Isolation Panel [NHANES]    Frequency of Communication with Friends and Family: More than three times a week    Frequency of Social Gatherings with Friends and Family: More than three times a week    Attends Religious Services: More than 4 times per year    Active Member of [employer] or Organizations: Yes    Attends Engineer, structural: More than 4 times per year    Marital Status: Married     Family History: The patient's family history includes ADD / ADHD in his son; Bipolar disorder in his maternal grandmother; Thyroid  disease in his daughter. There is no history of Liver disease, Esophageal cancer, or Colon cancer.  ROS:   Please see the history of present illness.    All other systems reviewed and are negative.  EKGs/Labs/Other Studies Reviewed:    The following studies were reviewed today: I discussed my findings with the patient at length   Recent Labs: 02/15/2023: BUN 12; Creatinine, Ser 1.01; Potassium 4.9; Sodium 142 04/04/2023: ALT 27  Recent Lipid Panel    Component Value Date/Time   CHOL 133 04/04/2023 0828   TRIG 118 04/04/2023 0828   HDL 53 04/04/2023 0828   CHOLHDL 2.5 04/04/2023 0828   CHOLHDL 4 08/09/2022 0809   VLDL 41.6 (H) 08/09/2022 0809   LDLCALC 59 04/04/2023 0828   LDLDIRECT 133.0 08/09/2022 0809    Physical Exam:    VS:  BP 100/68   Pulse 71   Ht 5\' 8"  (1.727 m)   Wt 180 lb 12.8 oz (82 kg)   SpO2 99%   BMI 27.49 kg/m     Wt Readings from Last 3  Encounters:  10/23/23 180 lb 12.8 oz (82 kg)  12/26/22 176 lb 1.3 oz (79.9 kg)  08/27/22 177 lb 12.8 oz (80.6 kg)     GEN: Patient is in no acute distress HEENT: Normal NECK: No JVD; No carotid bruits LYMPHATICS: No lymphadenopathy CARDIAC: Hear sounds regular, 2/6 systolic murmur at the apex. RESPIRATORY:  Clear to auscultation without rales, wheezing or rhonchi  ABDOMEN: Soft, non-tender, non-distended MUSCULOSKELETAL:  No edema; No deformity  SKIN: Warm and dry NEUROLOGIC:  Alert and oriented x 3 PSYCHIATRIC:  Normal affect   Signed, Nelia Balzarine, MD  10/23/2023 10:45 AM    [facility]

## 2023-10-23 NOTE — Patient Instructions (Signed)
 Medication Instructions:  Your physician recommends that you continue on your current medications as directed. Please refer to the Current Medication list given to you today.  *If you need a refill on your cardiac medications before your next appointment, please call your pharmacy*  Follow-Up: At [facility], you and your health needs are our priority.  As part of our continuing mission to provide you with exceptional heart care, our providers are all part of one team.  This team includes your primary Cardiologist (physician) and Advanced Practice Providers or APPs (Physician Assistants and Nurse Practitioners) who all work together to provide you with the care you need, when you need it.  Your next appointment:   12 month(s)  Provider:   Hillis Lu, MD   We recommend signing up for the patient portal called "MyChart".  Sign up information is provided on this After Visit Summary.  MyChart is used to connect with patients for Virtual Visits (Telemedicine).  Patients are able to view lab/test results, encounter notes, upcoming appointments, etc.  Non-urgent messages can be sent to your provider as well.   To learn more about what you can do with MyChart, go to [email or web address].

## 2023-12-06 ENCOUNTER — Ambulatory Visit: Admitting: Medical

## 2023-12-06 VITALS — BP 110/78 | HR 66 | Temp 98.0°F | Resp 16 | Ht 68.0 in | Wt 177.0 lb

## 2023-12-06 DIAGNOSIS — R739 Hyperglycemia, unspecified: Secondary | ICD-10-CM

## 2023-12-06 DIAGNOSIS — K649 Unspecified hemorrhoids: Secondary | ICD-10-CM

## 2023-12-06 DIAGNOSIS — Z125 Encounter for screening for malignant neoplasm of prostate: Secondary | ICD-10-CM | POA: Diagnosis not present

## 2023-12-06 DIAGNOSIS — R972 Elevated prostate specific antigen [PSA]: Secondary | ICD-10-CM

## 2023-12-06 DIAGNOSIS — Z0000 Encounter for general adult medical examination without abnormal findings: Secondary | ICD-10-CM

## 2023-12-06 DIAGNOSIS — R059 Cough, unspecified: Secondary | ICD-10-CM

## 2023-12-06 DIAGNOSIS — E663 Overweight: Secondary | ICD-10-CM

## 2023-12-06 DIAGNOSIS — R197 Diarrhea, unspecified: Secondary | ICD-10-CM | POA: Diagnosis not present

## 2023-12-06 LAB — COMPREHENSIVE METABOLIC PANEL WITH GFR
ALT: 21 U/L (ref 0–53)
AST: 21 U/L (ref 0–37)
Albumin: 4.5 g/dL (ref 3.5–5.2)
Alkaline Phosphatase: 58 U/L (ref 39–117)
BUN: 10 mg/dL (ref 6–23)
CO2: 29 meq/L (ref 19–32)
Calcium: 9.9 mg/dL (ref 8.4–10.5)
Chloride: 106 meq/L (ref 96–112)
Creatinine, Ser: 0.82 mg/dL (ref 0.40–1.50)
GFR: 99.31 mL/min (ref >60.00)
Glucose, Bld: 103 mg/dL — ABNORMAL HIGH (ref 70–99)
Potassium: 4.8 meq/L (ref 3.5–5.1)
Sodium: 141 meq/L (ref 135–145)
Total Bilirubin: 0.4 mg/dL (ref 0.2–1.2)
Total Protein: 7.9 g/dL (ref 6.0–8.3)

## 2023-12-06 LAB — LIPID PANEL
Cholesterol: 115 mg/dL (ref 0–200)
HDL: 43.9 mg/dL (ref >39.00)
LDL Cholesterol: 54 mg/dL (ref 0–99)
NonHDL: 70.86
Total CHOL/HDL Ratio: 3
Triglycerides: 83 mg/dL (ref 0.0–149.0)
VLDL: 16.6 mg/dL (ref 0.0–40.0)

## 2023-12-06 LAB — CBC WITH DIFFERENTIAL/PLATELET
Basophils Absolute: 0.1 K/uL (ref 0.0–0.1)
Basophils Relative: 0.5 % (ref 0.0–3.0)
Eosinophils Absolute: 0.3 K/uL (ref 0.0–0.7)
Eosinophils Relative: 3.4 % (ref 0.0–5.0)
HCT: 45 % (ref 39.0–52.0)
Hemoglobin: 15 g/dL (ref 13.0–17.0)
Lymphocytes Relative: 16.4 % (ref 12.0–46.0)
Lymphs Abs: 1.5 K/uL (ref 0.7–4.0)
MCHC: 33.3 g/dL (ref 30.0–36.0)
MCV: 92.7 fl (ref 78.0–100.0)
Monocytes Absolute: 0.6 K/uL (ref 0.1–1.0)
Monocytes Relative: 6.8 % (ref 3.0–12.0)
Neutro Abs: 6.8 K/uL (ref 1.4–7.7)
Neutrophils Relative %: 72.9 % (ref 43.0–77.0)
Platelets: 306 K/uL (ref 150.0–400.0)
RBC: 4.85 Mil/uL (ref 4.22–5.81)
RDW: 13.6 % (ref 11.5–15.5)
WBC: 9.4 K/uL (ref 4.0–10.5)

## 2023-12-06 LAB — HEMOGLOBIN A1C: Hgb A1c MFr Bld: 5.7 % (ref 4.6–6.5)

## 2023-12-06 LAB — PSA: PSA: 3.39 ng/mL (ref 0.10–4.00)

## 2023-12-06 MED ORDER — AZITHROMYCIN 250 MG PO TABS: ORAL_TABLET | ORAL | 0 refills | Status: AC

## 2023-12-06 MED ORDER — HYDROCODONE BIT-HOMATROP MBR 5-1.5 MG/5ML PO SOLN: 5.0000 mL | Freq: Four times a day (QID) | ORAL | 0 refills | Status: AC | PRN

## 2023-12-06 NOTE — Progress Notes (Signed)
 Subjective:    Patient ID: Juan Collier, male    DOB: [birth year], 53 y.o.   MRN: [id number]  HPI  Pt in wellness exam.   Pt is fasting. He wants flu vaccine.   Pt is exercising/jogging regularly. Jogging 3 days a week. 3  miles a day and walking during the day.    Pt states his diet is good overall.   Pt will get second shingrix later this summer per his request.      PHYLLIP Collier is a 53 year old male who presents with a persistent cough and recent travel-related diarrhea.  He has a persistent dry cough that began around July 4th, following his return from [place] on July 7th. Initially mild, the cough escalated significantly by July 14th. It is described as dry and irritating, often triggered by an itchy sensation in the throat, leading to severe bouts that disrupt sleep, waking him up four to five times a night. No history of asthma, and the inhaler provided by urgent care has not been effective. He has been using benzonatate for the cough but finds it insufficient, especially at night.  He experienced diarrhea during his trip to [place], which resolved after about a week. He attributes the diarrhea to possible traveler's diarrhea or bacterial food poisoning, as he and his travel companions all fell ill. His stools have since returned to normal consistency.  He is actively trying to lose weight and has lost approximately five pounds, bringing his weight down to 175 pounds. He is currently fasting every morning and continues to exercise, including running. He is motivated to reach a healthier weight to improve his overall health and potentially alleviate knee pain.  He is currently taking cholesterol medication and is monitoring his diet and exercise to manage his cholesterol and weight. He works from home and spends most of his time on the phone. He does not smoke and uses a humidifier to help with his cough.     Review of Systems  Constitutional:  Negative for chills and  fever.  HENT:  Negative for congestion and ear pain.   Respiratory:  Positive for cough. Negative for chest tightness, wheezing and stridor.   Cardiovascular:  Negative for chest pain and palpitations.  Gastrointestinal:  Negative for abdominal pain, blood in stool, nausea and rectal pain.       Pt notes stll has hemorrhoid. He states still causing problems intermittent. When he had diarrhea hemorrhoid was worse.  Musculoskeletal:  Negative for back pain and myalgias.       No leg pain  Skin:  Negative for rash.  Neurological:  Negative for dizziness, weakness and numbness.  Hematological:  Negative for adenopathy. Does not bruise/bleed easily.  Psychiatric/Behavioral:  Negative for behavioral problems, decreased concentration and suicidal ideas. The patient is not nervous/anxious and is not hyperactive.      Past Medical History:  Diagnosis Date   Allergy    Backache 07/13/2010   Qualifier: Diagnosis of   By: Cleatrice MD, Ludie SCULL SNOMED Dx Update Oct 2024     History of fainting spells of unknown cause    History of stomach ulcers    Left shoulder pain 07/02/2016   06/14/2016.  [facility].  Therisa Stalker, MD.  Question labral tear.  I would recommend MR arthrogram as he has failed 3 mths of conservative tx.  He does agree to proceed.  That will be scheduled and I will get  back with him when that is available for review.     Rheumatic fever    Snoring 08/27/2022   Tuberculosis positve    WRIST PAIN, LEFT 07/13/2010   Qualifier: Diagnosis of   By: Cleatrice MD, Ludie           Social History   Socioeconomic History   Marital status: Married    Spouse name: Not on file   Number of children: 3   Years of education: Not on file   Highest education level: Not on file  Occupational History   Not on file  Tobacco Use   Smoking status: Never   Smokeless tobacco: Never  Vaping Use   Vaping status: Never Used  Substance and Sexual Activity   Alcohol  use: Yes    Alcohol/week: 2.0 standard drinks of alcohol    Types: 2 Cans of beer per week    Comment: occ   Drug use: No   Sexual activity: Yes    Birth control/protection: None  Other Topics Concern   Not on file  Social History Narrative   Not on file   Social Drivers of Health   Financial Resource Strain: Low Risk  (08/22/2022)   Overall Financial Resource Strain (CARDIA)    Difficulty of Paying Living Expenses: Not hard at all  Food Insecurity: No Food Insecurity (08/22/2022)   Hunger Vital Sign    Worried About Running Out of Food in the Last Year: Never true    Ran Out of Food in the Last Year: Never true  Transportation Needs: No Transportation Needs (08/22/2022)   PRAPARE - Administrator, Civil Service (Medical): No    Lack of Transportation (Non-Medical): No  Physical Activity: Inactive (08/22/2022)   Exercise Vital Sign    Days of Exercise per Week: 3 days    Minutes of Exercise per Session: 0 min  Stress: No Stress Concern Present (08/22/2022)   [employer] of Occupational Health - Occupational Stress Questionnaire    Feeling of Stress : Not at all  Social Connections: Socially Integrated (08/22/2022)   Social Connection and Isolation Panel    Frequency of Communication with Friends and Family: More than three times a week    Frequency of Social Gatherings with Friends and Family: More than three times a week    Attends Religious Services: More than 4 times per year    Active Member of [employer] or Organizations: Yes    Attends Engineer, structural: More than 4 times per year    Marital Status: Married  Catering manager Violence: Not At Risk (08/22/2022)   Humiliation, Afraid, Rape, and Kick questionnaire    Fear of Current or Ex-Partner: No    Emotionally Abused: No    Physically Abused: No    Sexually Abused: No    Past Surgical History:  Procedure Laterality Date   NO PAST SURGERIES      Family History  Problem Relation Age of Onset    Bipolar disorder Maternal Grandmother    Thyroid  disease Daughter    ADD / ADHD Son    Liver disease Neg Hx    Esophageal cancer Neg Hx    Colon cancer Neg Hx     Allergies  Allergen Reactions   Mold Extract [Trichophyton]    Oak Bark [Quercus Robur]    Pollen Extract     Current Outpatient Medications on File Prior to Visit  Medication Sig Dispense Refill   azelastine (ASTELIN) 0.1 %  nasal spray Place 1 spray into both nostrils as needed.     EPINEPHrine 0.3 mg/0.3 mL IJ SOAJ injection Inject 0.3 mg into the muscle as needed for anaphylaxis.     rosuvastatin  (CRESTOR ) 10 MG tablet Take 1 tablet (10 mg total) by mouth daily. 90 tablet 3   No current facility-administered medications on file prior to visit.    BP 110/78 (BP Location: Left Arm, Patient Position: Sitting)   Pulse 66   Temp 98 F (36.7 C) (Oral)   Resp 16   Ht 5' 8 (1.727 m)   Wt 177 lb (80.3 kg)   SpO2 98%   BMI 26.91 kg/m        Objective:   Physical Exam   General Mental Status- Alert. General Appearance- Not in acute distress.   Skin General: Color- Normal Color. Moisture- Normal Moisture.  Neck No carotid bruits. No JVD.  Chest and Lung Exam Auscultation: Breath Sounds:-CTA  Cardiovascular Auscultation:Rythm- RRR Murmurs & Other Heart Sounds:Auscultation of the heart reveals- No Murmurs.  Abdomen Inspection:-Inspeection Normal. Palpation/Percussion:Note:No mass. Palpation and Percussion of the abdomen reveal- Non Tender, Non Distended + BS, no rebound or guarding.   Neurologic Cranial Nerve exam:- CN III-XII intact(No nystagmus), symmetric smile. Strength:- 5/5 equal and symmetric strength both upper and lower extremities.   Lower ext- no pedal edema. Calfs symmetric. Negative homans signs.    Assessment & Plan:   Patient Instructions  For you wellness exam today I have ordered cbc, cmp, psa and lipid panel.  Shingrix deferred to later date. Can do later as nurse visit when  you are ready.  Up to date on colonoscopy  Recommend exercise and healthy diet.  We will let you know lab results as they come in.  Follow up date appointment will be determined after lab review.     Cough for 2 weeks. Bronchitis like symptoms. Persistent dry cough unresponsive to benzonatate or inhaler. Differential includes pertussis, bronchitis, or other respiratory infections. Chest x-ray ordered. Azithromycin prescribed for  bronchitis, or pneumonia. Hycodan prescribed for severe nocturnal cough with caution due to sedative effects. - Prescribe azithromycin. - Order chest x-ray. - Prescribe Hycodan for severe nocturnal cough. - Advise against driving while using Hycodan.  Hyperlipidemia On cholesterol medication. BMI overweight. Discussed metformin for weight loss if prediabetes confirmed. GLP-1 medications unlikely covered by insurance due to BMI and potential side effects. - Continue current cholesterol medication. - Order wellness labs including CBC, metabolic panel, lipid panel, and A1c. - Consider metformin if prediabetes is confirmed.  Overweight  -diet, exercise and might add on metformin after lab review.  Recent loose stools now resolved. If reoccur let me will do stool panel studies.     Dallas Maxwell, PA-C     [id number] charge as did address cough/bronchitis, ordered cxr, discussed recent loose stools and plan for weight loss/overweight.

## 2023-12-06 NOTE — Patient Instructions (Addendum)
 For you wellness exam today I have ordered cbc, cmp, psa and lipid panel.  Shingrix deferred to later date. Can do later as nurse visit when you are ready.  Up to date on colonoscopy  Recommend exercise and healthy diet.  We will let you know lab results as they come in.  Follow up date appointment will be determined after lab review.     Cough for 2 weeks. Bronchitis like symptoms. Persistent dry cough unresponsive to benzonatate or inhaler. Differential includes pertussis, bronchitis, or other respiratory infections. Chest x-ray ordered. Azithromycin prescribed for  bronchitis, or pneumonia. Hycodan prescribed for severe nocturnal cough with caution due to sedative effects. - Prescribe azithromycin. - Order chest x-ray. - Prescribe Hycodan for severe nocturnal cough. - Advise against driving while using Hycodan.  Hyperlipidemia On cholesterol medication. BMI overweight. Discussed metformin for weight loss if prediabetes confirmed. GLP-1 medications unlikely covered by insurance due to BMI and potential side effects. - Continue current cholesterol medication. - Order wellness labs including CBC, metabolic panel, lipid panel, and A1c. - Consider metformin if prediabetes is confirmed.  Overweight  -Diet, exercise and might add on metformin after lab review.   Recent loose stools now resolved. If reoccur let me will do stool panel studies.   Preventive Care 53 Years Old, Male Preventive care refers to lifestyle choices and visits with your health care provider that can promote health and wellness. Preventive care visits are also called wellness exams. What can I expect for my preventive care visit? Counseling During your preventive care visit, your health care provider may ask about your: Medical history, including: Past medical problems. Family medical history. Current health, including: Emotional well-being. Home life and relationship well-being. Sexual  activity. Lifestyle, including: Alcohol, nicotine or tobacco, and drug use. Access to firearms. Diet, exercise, and sleep habits. Safety issues such as seatbelt and bike helmet use. Sunscreen use. Work and work Astronomer. Physical exam Your health care provider will check your: Height and weight. These may be used to calculate your BMI (body mass index). BMI is a measurement that tells if you are at a healthy weight. Waist circumference. This measures the distance around your waistline. This measurement also tells if you are at a healthy weight and may help predict your risk of certain diseases, such as type 2 diabetes and high blood pressure. Heart rate and blood pressure. Body temperature. Skin for abnormal spots. What immunizations do I need?  Vaccines are usually given at various ages, according to a schedule. Your health care provider will recommend vaccines for you based on your age, medical history, and lifestyle or other factors, such as travel or where you work. What tests do I need? Screening Your health care provider may recommend screening tests for certain conditions. This may include: Lipid and cholesterol levels. Diabetes screening. This is done by checking your blood sugar (glucose) after you have not eaten for a while (fasting). Hepatitis B test. Hepatitis C test. HIV (human immunodeficiency virus) test. STI (sexually transmitted infection) testing, if you are at risk. Lung cancer screening. Prostate cancer screening. Colorectal cancer screening. Talk with your health care provider about your test results, treatment options, and if necessary, the need for more tests. Follow these instructions at home: Eating and drinking  Eat a diet that includes fresh fruits and vegetables, whole grains, lean protein, and low-fat dairy products. Take vitamin and mineral supplements as recommended by your health care provider. Do not drink alcohol if your health care provider  tells  you not to drink. If you drink alcohol: Limit how much you have to 0-2 drinks a day. Know how much alcohol is in your drink. In the [place] one drink equals one 12 oz bottle of beer (355 mL), one 5 oz glass of wine (148 mL), or one 1 oz glass of hard liquor (44 mL). Lifestyle Brush your teeth every morning and night with fluoride toothpaste. Floss one time each day. Exercise for at least 30 minutes 5 or more days each week. Do not use any products that contain nicotine or tobacco. These products include cigarettes, chewing tobacco, and vaping devices, such as e-cigarettes. If you need help quitting, ask your health care provider. Do not use drugs. If you are sexually active, practice safe sex. Use a condom or other form of protection to prevent STIs. Take aspirin only as told by your health care provider. Make sure that you understand how much to take and what form to take. Work with your health care provider to find out whether it is safe and beneficial for you to take aspirin daily. Find healthy ways to manage stress, such as: Meditation, yoga, or listening to music. Journaling. Talking to a trusted person. Spending time with friends and family. Minimize exposure to UV radiation to reduce your risk of skin cancer. Safety Always wear your seat belt while driving or riding in a vehicle. Do not drive: If you have been drinking alcohol. Do not ride with someone who has been drinking. When you are tired or distracted. While texting. If you have been using any mind-altering substances or drugs. Wear a helmet and other protective equipment during sports activities. If you have firearms in your house, make sure you follow all gun safety procedures. What's next? Go to your health care provider once a year for an annual wellness visit. Ask your health care provider how often you should have your eyes and teeth checked. Stay up to date on all vaccines. This information is not intended to  replace advice given to you by your health care provider. Make sure you discuss any questions you have with your health care provider. Document Revised: 11/02/2020 Document Reviewed: 11/02/2020 Elsevier Patient Education  2024 [employer].

## 2023-12-07 ENCOUNTER — Ambulatory Visit: Payer: Self-pay | Admitting: Medical

## 2023-12-07 NOTE — Addendum Note (Signed)
 Addended by: DORINA DALLAS HERO on: 12/07/2023 07:26 AM   Modules accepted: Orders

## 2024-01-13 ENCOUNTER — Encounter: Payer: Self-pay | Admitting: Medical

## 2024-01-21 ENCOUNTER — Encounter: Payer: Self-pay | Admitting: Medical

## 2024-01-21 ENCOUNTER — Telehealth: Payer: Self-pay | Admitting: Urology

## 2024-01-21 NOTE — Telephone Encounter (Signed)
 Called pt to confirm information he has a referral but it is locked due to unable to contact restrictions so it cannot not be Assigned. Msg sent to [facility] to unlock on 01/21/24.

## 2024-02-17 ENCOUNTER — Encounter: Payer: Self-pay | Admitting: Urology

## 2024-02-17 ENCOUNTER — Ambulatory Visit: Admitting: Urology

## 2024-02-17 VITALS — BP 133/76 | HR 67 | Ht 69.0 in | Wt 172.0 lb

## 2024-02-17 DIAGNOSIS — R972 Elevated prostate specific antigen [PSA]: Secondary | ICD-10-CM

## 2024-02-17 LAB — URINALYSIS, ROUTINE W REFLEX MICROSCOPIC
Bilirubin, UA: NEGATIVE
Glucose, UA: NEGATIVE
Ketones, UA: NEGATIVE
Leukocytes,UA: NEGATIVE
Nitrite, UA: NEGATIVE
Protein,UA: NEGATIVE
RBC, UA: NEGATIVE
Specific Gravity, UA: 1.015 (ref 1.005–1.030)
Urobilinogen, Ur: 0.2 mg/dL (ref 0.2–1.0)
pH, UA: 6.5 (ref 5.0–7.5)

## 2024-02-17 NOTE — Progress Notes (Signed)
 Assessment: 1. Rising PSA level     Plan: I personally reviewed the patient's chart including provider notes, and lab results. PSA velocity is approximately 0.66 ng/milliliters/year. I discussed possible causes of PSA increase and elevation including prostate cancer, BPH, and prostatitis/UTI. PSA today - will contact him with results and recommendations   Chief Complaint:  Chief Complaint  Patient presents with   Elevated PSA    History of Present Illness:  Juan Collier is a 53 y.o. male who is seen in consultation from Dorina Loving, PA-C for evaluation of rising PSA. PSA results: 10/20 1.36 2/22 1.48 3/23 1.84 7/25 3.39  No prior history of elevated PSA.  No history of UTIs or prostatitis.  No family history of prostate cancer. He does report intermittent lower urinary tract symptoms with occasional dysuria, postvoid dribbling.  No gross hematuria or flank pain.  No significant lower urinary tract symptoms at the present time. IPSS = 4/2.   Past Medical History:  Past Medical History:  Diagnosis Date   Allergy    Backache 07/13/2010   Qualifier: Diagnosis of   By: Cleatrice MD, Ludie SCULL SNOMED Dx Update Oct 2024     History of fainting spells of unknown cause    History of stomach ulcers    Left shoulder pain 07/02/2016   06/14/2016.  [facility].  Therisa Stalker, MD.  Question labral tear.  I would recommend MR arthrogram as he has failed 3 mths of conservative tx.  He does agree to proceed.  That will be scheduled and I will get back with him when that is available for review.     Rheumatic fever    Snoring 08/27/2022   Tuberculosis positve    WRIST PAIN, LEFT 07/13/2010   Qualifier: Diagnosis of   By: Cleatrice MD, Ludie          Past Surgical History:  Past Surgical History:  Procedure Laterality Date   NO PAST SURGERIES      Allergies:  Allergies  Allergen Reactions   Mold Extract [Trichophyton]    Oak Bark [Quercus  Robur]    Pollen Extract     Family History:  Family History  Problem Relation Age of Onset   Bipolar disorder Maternal Grandmother    Thyroid  disease Daughter    ADD / ADHD Son    Liver disease Neg Hx    Esophageal cancer Neg Hx    Colon cancer Neg Hx     Social History:  Social History   Tobacco Use   Smoking status: Never   Smokeless tobacco: Never  Vaping Use   Vaping status: Never Used  Substance Use Topics   Alcohol use: Yes    Alcohol/week: 2.0 standard drinks of alcohol    Types: 2 Cans of beer per week    Comment: occ   Drug use: No    Review of symptoms:  Constitutional:  Negative for unexplained weight loss, night sweats, fever, chills ENT:  Negative for nose bleeds, sinus pain, painful swallowing CV:  Negative for chest pain, shortness of breath, exercise intolerance, palpitations, loss of consciousness Resp:  Negative for cough, wheezing, shortness of breath GI:  Negative for nausea, vomiting, diarrhea, bloody stools GU:  Positives noted in HPI; otherwise negative for gross hematuria, dysuria, urinary incontinence Neuro:  Negative for seizures, poor balance, limb weakness, slurred speech Psych:  Negative for lack of energy, depression, anxiety Endocrine:  Negative for polydipsia, polyuria, symptoms of  hypoglycemia (dizziness, hunger, sweating) Hematologic:  Negative for anemia, purpura, petechia, prolonged or excessive bleeding, use of anticoagulants  Allergic:  Negative for difficulty breathing or choking as a result of exposure to anything; no shellfish allergy; no allergic response (rash/itch) to materials, foods  Physical exam: BP 133/76   Pulse 67   Ht 5' 9 (1.753 m)   Wt 172 lb (78 kg)   BMI 25.40 kg/m  GENERAL APPEARANCE:  Well appearing, well developed, well nourished, NAD HEENT: Atraumatic, Normocephalic, oropharynx clear. NECK: Supple without lymphadenopathy or thyromegaly. LUNGS: Clear to auscultation bilaterally. HEART: Regular Rate and  Rhythm without murmurs, gallops, or rubs. ABDOMEN: Soft, non-tender, No Masses. EXTREMITIES: Moves all extremities well.  Without clubbing, cyanosis, or edema. NEUROLOGIC:  Alert and oriented x 3, normal gait, CN II-XII grossly intact.  MENTAL STATUS:  Appropriate. BACK:  Non-tender to palpation.  No CVAT SKIN:  Warm, dry and intact.   GU: Penis:  circumcised Meatus: Normal Scrotum: normal, no masses Testis: normal without masses bilateral Epididymis: normal Prostate: 30 g, NT, no nodules Rectum: Normal tone,  no masses or tenderness   Results: U/A: negative

## 2024-02-18 ENCOUNTER — Telehealth: Payer: Self-pay | Admitting: Urology

## 2024-02-18 ENCOUNTER — Ambulatory Visit: Payer: Self-pay | Admitting: Urology

## 2024-02-18 DIAGNOSIS — R972 Elevated prostate specific antigen [PSA]: Secondary | ICD-10-CM

## 2024-02-18 LAB — PSA: Prostate Specific Ag, Serum: 3.1 ng/mL (ref 0.0–4.0)

## 2024-02-18 NOTE — Telephone Encounter (Signed)
 Called and lvm for pt to call back and schedule 3 mth lab.

## 2024-02-19 NOTE — Telephone Encounter (Signed)
 Called and lvm for pt to call back and schedule 3 mth lab  on 02/19/24.

## 2024-02-25 ENCOUNTER — Telehealth: Payer: Self-pay | Admitting: Urology

## 2024-02-25 NOTE — Telephone Encounter (Deleted)
-----   Message from Oxly B sent at 02/24/2024  1:49 PM EDT ----- Are you going to continue to call him? ----- Message ----- From: Maranda Alan SAILOR Sent: 02/18/2024  11:53 AM EDT To: Madelin CHRISTELLA Edison  Pt called and vm left to call and schedule ----- Message ----- From: Roseann Adine PARAS., MD Sent: 02/18/2024  11:47 AM EDT To: Alan SAILOR Maranda  Please schedule lab visit for PSA in 3 months.

## 2024-02-25 NOTE — Telephone Encounter (Signed)
 Called pt to schedule 3 month lab visit per [place] no anwser lvm 02/25/24

## 2024-02-26 NOTE — Telephone Encounter (Signed)
 Called pt to schedule 3 month lab visit per [place] no anwser lvm 02/26/24

## 2024-03-09 ENCOUNTER — Other Ambulatory Visit: Payer: Self-pay

## 2024-03-09 ENCOUNTER — Ambulatory Visit: Payer: Self-pay | Admitting: Surgery

## 2024-05-04 ENCOUNTER — Other Ambulatory Visit: Payer: Self-pay | Admitting: Cardiology

## 2024-05-04 DIAGNOSIS — E785 Hyperlipidemia, unspecified: Secondary | ICD-10-CM

## 2024-05-04 DIAGNOSIS — R931 Abnormal findings on diagnostic imaging of heart and coronary circulation: Secondary | ICD-10-CM

## 2024-05-27 ENCOUNTER — Other Ambulatory Visit

## 2024-05-27 DIAGNOSIS — R972 Elevated prostate specific antigen [PSA]: Secondary | ICD-10-CM

## 2024-05-27 LAB — PSA: Prostate Specific Ag, Serum: 2.8 ng/mL (ref 0.0–4.0)

## 2024-05-28 ENCOUNTER — Ambulatory Visit: Payer: Self-pay | Admitting: Urology

## 2024-09-09 ENCOUNTER — Ambulatory Visit: Admitting: Urology
